# Patient Record
Sex: Male | Born: 1950 | Race: White | Hispanic: No | Marital: Married | Smoking: Never smoker
Health system: Southern US, Community
[De-identification: ages and names within clinical notes are randomized; demographics above are authoritative.]

## PROBLEM LIST (undated history)

## (undated) DIAGNOSIS — I10 Essential (primary) hypertension: Secondary | ICD-10-CM

---

## 2004-02-13 ENCOUNTER — Other Ambulatory Visit: Payer: Self-pay

## 2005-07-28 ENCOUNTER — Ambulatory Visit: Payer: Self-pay | Admitting: Family Medicine

## 2013-05-04 ENCOUNTER — Emergency Department: Payer: Self-pay | Admitting: Emergency Medicine

## 2014-04-21 ENCOUNTER — Ambulatory Visit: Payer: Self-pay | Admitting: Gastroenterology

## 2014-05-19 ENCOUNTER — Emergency Department: Payer: Self-pay | Admitting: Student

## 2014-05-19 LAB — COMPREHENSIVE METABOLIC PANEL
ALBUMIN: 3.8 g/dL (ref 3.4–5.0)
Alkaline Phosphatase: 89 U/L
Anion Gap: 11 (ref 7–16)
BUN: 19 mg/dL — ABNORMAL HIGH (ref 7–18)
Bilirubin,Total: 0.7 mg/dL (ref 0.2–1.0)
CREATININE: 1.78 mg/dL — AB (ref 0.60–1.30)
Calcium, Total: 9 mg/dL (ref 8.5–10.1)
Chloride: 107 mmol/L (ref 98–107)
Co2: 23 mmol/L (ref 21–32)
EGFR (African American): 46 — ABNORMAL LOW
GFR CALC NON AF AMER: 40 — AB
Glucose: 115 mg/dL — ABNORMAL HIGH (ref 65–99)
Osmolality: 284 (ref 275–301)
Potassium: 3.4 mmol/L — ABNORMAL LOW (ref 3.5–5.1)
SGOT(AST): 49 U/L — ABNORMAL HIGH (ref 15–37)
SGPT (ALT): 41 U/L
Sodium: 141 mmol/L (ref 136–145)
Total Protein: 6.8 g/dL (ref 6.4–8.2)

## 2014-05-19 LAB — TROPONIN I: Troponin-I: <0.02 ng/mL

## 2014-05-19 LAB — CBC
HCT: 46.2 % (ref 40.0–52.0)
HGB: 15.6 g/dL (ref 13.0–18.0)
MCH: 30.2 pg (ref 26.0–34.0)
MCHC: 33.7 g/dL (ref 32.0–36.0)
MCV: 90 fL (ref 80–100)
Platelet: 222 10*3/uL (ref 150–440)
RBC: 5.15 10*6/uL (ref 4.40–5.90)
RDW: 14.2 % (ref 11.5–14.5)
WBC: 8.8 10*3/uL (ref 3.8–10.6)

## 2014-05-19 LAB — APTT: ACTIVATED PTT: 24.4 s (ref 23.6–35.9)

## 2014-05-19 LAB — PROTIME-INR
INR: 1.1
PROTHROMBIN TIME: 14 s (ref 11.5–14.7)

## 2015-08-01 IMAGING — CR DG CHEST 1V PORT
1 series · 1 of 1 positions shown · non-contrast
Comparison: None.

CLINICAL DATA: Syncope

EXAM:
PORTABLE CHEST - 1 VIEW

[ap]
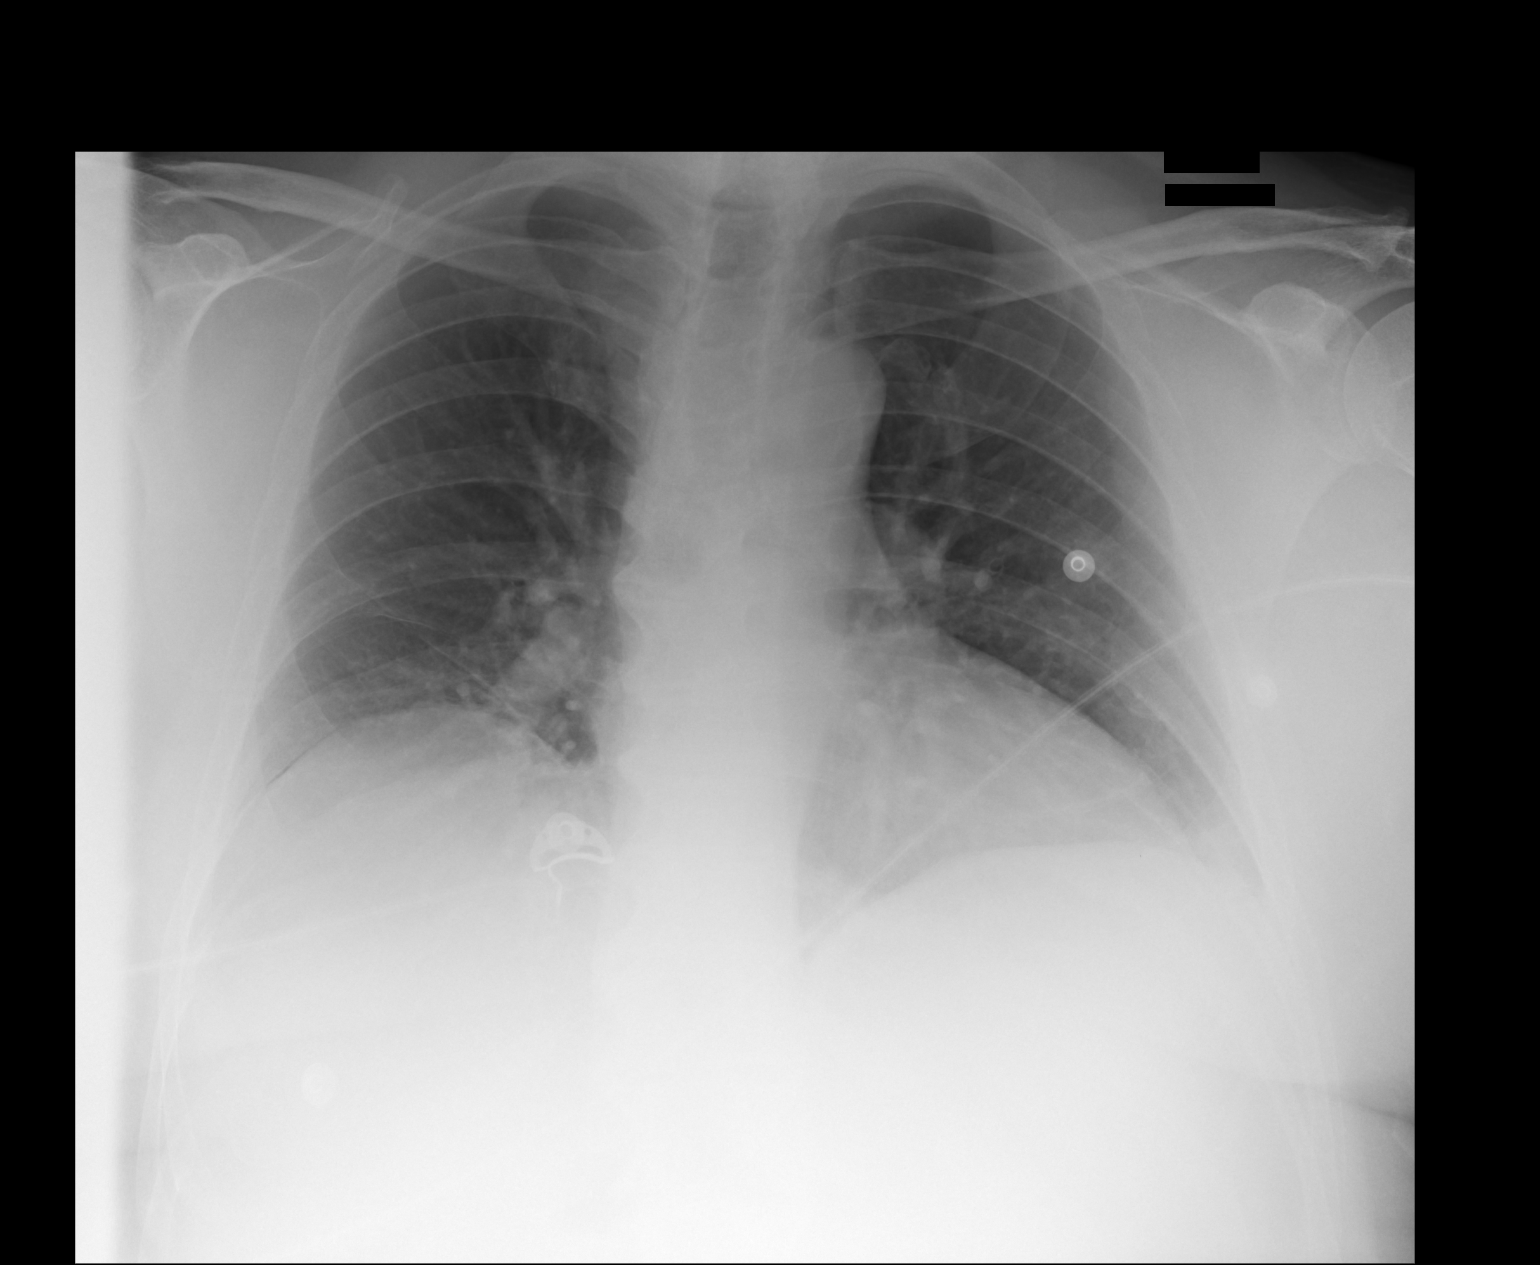

[1 of 1 positions shown; findings below may reference images not displayed]

FINDINGS: Cardiomediastinal silhouette is unremarkable. There is elevation of
the right hemidiaphragm. No acute infiltrate or pleural effusion. No
pulmonary edema. Mild degenerative changes thoracic spine.
IMPRESSION: No active disease.

## 2015-10-24 ENCOUNTER — Other Ambulatory Visit: Payer: Self-pay | Admitting: Family Medicine

## 2015-10-24 DIAGNOSIS — R1011 Right upper quadrant pain: Secondary | ICD-10-CM

## 2015-10-30 ENCOUNTER — Ambulatory Visit
Admission: RE | Admit: 2015-10-30 | Discharge: 2015-10-30 | Disposition: A | Discharge status: Home / Self Care | Payer: BC Managed Care – PPO | Source: Ambulatory Visit | Attending: Family Medicine | Admitting: Family Medicine

## 2015-10-30 DIAGNOSIS — R112 Nausea with vomiting, unspecified: Secondary | ICD-10-CM | POA: Diagnosis not present

## 2015-10-30 DIAGNOSIS — R1011 Right upper quadrant pain: Secondary | ICD-10-CM | POA: Insufficient documentation

## 2015-10-30 DIAGNOSIS — Z9049 Acquired absence of other specified parts of digestive tract: Secondary | ICD-10-CM | POA: Insufficient documentation

## 2018-10-20 ENCOUNTER — Other Ambulatory Visit: Payer: Self-pay | Admitting: Family Medicine

## 2018-10-20 DIAGNOSIS — Z136 Encounter for screening for cardiovascular disorders: Secondary | ICD-10-CM

## 2018-10-20 DIAGNOSIS — R109 Unspecified abdominal pain: Secondary | ICD-10-CM

## 2018-11-04 ENCOUNTER — Ambulatory Visit: Payer: Commercial Managed Care - HMO

## 2018-11-11 ENCOUNTER — Ambulatory Visit
Admission: RE | Admit: 2018-11-11 | Discharge: 2018-11-11 | Disposition: A | Discharge status: Home / Self Care | Payer: Medicare Other | Source: Ambulatory Visit | Attending: Family Medicine | Admitting: Family Medicine

## 2018-11-11 DIAGNOSIS — R109 Unspecified abdominal pain: Secondary | ICD-10-CM | POA: Diagnosis present

## 2018-11-11 DIAGNOSIS — Z136 Encounter for screening for cardiovascular disorders: Secondary | ICD-10-CM | POA: Diagnosis present

## 2019-05-20 ENCOUNTER — Emergency Department
Admission: EM | Admit: 2019-05-20 | Discharge: 2019-05-20 | Disposition: A | Discharge status: Home / Self Care | Payer: Medicare Other | Attending: Emergency Medicine | Admitting: Emergency Medicine

## 2019-05-20 ENCOUNTER — Other Ambulatory Visit: Payer: Self-pay

## 2019-05-20 ENCOUNTER — Emergency Department: Payer: Medicare Other

## 2019-05-20 DIAGNOSIS — R609 Edema, unspecified: Secondary | ICD-10-CM

## 2019-05-20 DIAGNOSIS — R600 Localized edema: Secondary | ICD-10-CM

## 2019-05-20 DIAGNOSIS — R2243 Localized swelling, mass and lump, lower limb, bilateral: Secondary | ICD-10-CM | POA: Diagnosis not present

## 2019-05-20 DIAGNOSIS — F17228 Nicotine dependence, chewing tobacco, with other nicotine-induced disorders: Secondary | ICD-10-CM | POA: Diagnosis not present

## 2019-05-20 DIAGNOSIS — Z79899 Other long term (current) drug therapy: Secondary | ICD-10-CM | POA: Insufficient documentation

## 2019-05-20 LAB — COMPREHENSIVE METABOLIC PANEL
ALT: 41 U/L (ref 0–44)
AST: 32 U/L (ref 15–41)
Albumin: 4.9 g/dL (ref 3.5–5.0)
Alkaline Phosphatase: 102 U/L (ref 38–126)
Anion gap: 13 (ref 5–15)
BUN: 12 mg/dL (ref 8–23)
CO2: 24 mmol/L (ref 22–32)
Calcium: 10.3 mg/dL (ref 8.9–10.3)
Chloride: 102 mmol/L (ref 98–111)
Creatinine, Ser: 1.01 mg/dL (ref 0.61–1.24)
GFR calc Af Amer: >60 mL/min (ref >60)
GFR calc non Af Amer: >60 mL/min (ref >60)
Glucose, Bld: 106 mg/dL — ABNORMAL HIGH (ref 70–99)
Potassium: 3.8 mmol/L (ref 3.5–5.1)
Sodium: 139 mmol/L (ref 135–145)
Total Bilirubin: 1.1 mg/dL (ref 0.3–1.2)
Total Protein: 7.8 g/dL (ref 6.5–8.1)

## 2019-05-20 LAB — CBC WITH DIFFERENTIAL/PLATELET
Abs Immature Granulocytes: 0.04 10*3/uL (ref 0.00–0.07)
Basophils Absolute: 0.1 10*3/uL (ref 0.0–0.1)
Basophils Relative: 1 %
Eosinophils Absolute: 0.2 10*3/uL (ref 0.0–0.5)
Eosinophils Relative: 2 %
HCT: 48.4 % (ref 39.0–52.0)
Hemoglobin: 16.4 g/dL (ref 13.0–17.0)
Immature Granulocytes: 1 %
Lymphocytes Relative: 19 %
Lymphs Abs: 1.5 10*3/uL (ref 0.7–4.0)
MCH: 29.7 pg (ref 26.0–34.0)
MCHC: 33.9 g/dL (ref 30.0–36.0)
MCV: 87.7 fL (ref 80.0–100.0)
Monocytes Absolute: 1.1 10*3/uL — ABNORMAL HIGH (ref 0.1–1.0)
Monocytes Relative: 14 %
Neutro Abs: 5 10*3/uL (ref 1.7–7.7)
Neutrophils Relative %: 63 %
Platelets: 216 10*3/uL (ref 150–400)
RBC: 5.52 MIL/uL (ref 4.22–5.81)
RDW: 14 % (ref 11.5–15.5)
WBC: 7.8 10*3/uL (ref 4.0–10.5)
nRBC: 0 % (ref 0.0–0.2)

## 2019-05-20 LAB — BRAIN NATRIURETIC PEPTIDE: B Natriuretic Peptide: 27 pg/mL (ref 0.0–100.0)

## 2019-05-20 MED ORDER — AMLODIPINE BESYLATE 5 MG PO TABS: 5.0000 mg | ORAL_TABLET | Freq: Every day | ORAL | 2 refills | Status: DC

## 2019-05-20 NOTE — Discharge Instructions (Signed)
Wear compression stockings at home. Take 5 mg of amlodipine daily instead of the 10 mg tablets you have been taking.

## 2019-05-20 NOTE — ED Triage Notes (Signed)
Pt c/o hand and feet swelling last 2 days. Reports hand swelling has decreased but feet still swollen. Pt started he started taking new medication prior to swelling. Pt states he had stopped taking medications due to cost and recently was able to get them filled. Pt has list of medication he just restarted.  Started taking  Pt  NAD noted at this time.

## 2019-05-20 NOTE — ED Provider Notes (Signed)
[facility] Emergency Department Provider Note  ____________________________________________  Time seen: Approximately 7:06 PM  I have reviewed the triage vital signs and the nursing notes.   HISTORY  Chief Complaint Leg Swelling    HPI Larry KatosRobert B Wolfgang III is a 68 y.o. male presents to the emergency department with bilateral lower leg swelling over the past 2 days.  Patient recently started taking amlodipine as well as other blood pressure medications.  Patient denies any history of CHF.  No shortness of breath, cough or increased pillow usage at home.  Patient denies any history of hepatic issues in the past.  Patient denies prolonged immobility, recent surgery or daily smoking.  No prior history of DVT or PE.  He denies chest pain, chest tightness or abdominal pain.  No other alleviating measures have been attempted.        History reviewed. No pertinent past medical history.  There are no active problems to display for this patient.   History reviewed. No pertinent surgical history.  Prior to Admission medications   Medication Sig Start Date End Date Taking? Authorizing Provider  albuterol (VENTOLIN HFA) 108 (90 Base) MCG/ACT inhaler Inhale 2 puffs into the lungs every 4 (four) hours as needed for wheezing or shortness of breath.   Yes [provider]  allopurinol (ZYLOPRIM) 100 MG tablet Take 200 mg by mouth daily.   Yes [provider]  famotidine (PEPCID) 20 MG tablet Take 20 mg by mouth 2 (two) times daily.   Yes [provider]  gabapentin (NEURONTIN) 300 MG capsule Take 300 mg by mouth 3 (three) times daily.   Yes [provider]  indomethacin (INDOCIN) 25 MG capsule Take 25 mg by mouth 2 (two) times daily with a meal.   Yes [provider]  lisinopril-hydrochlorothiazide (ZESTORETIC) 20-25 MG tablet Take 1 tablet by mouth daily.   Yes [provider]  lovastatin (MEVACOR) 40 MG tablet Take 40 mg  by mouth at bedtime.   Yes [provider]  metoprolol tartrate (LOPRESSOR) 100 MG tablet Take 100 mg by mouth 2 (two) times daily.   Yes [provider]  potassium chloride (K-DUR) 10 MEQ tablet Take 10 mEq by mouth daily.   Yes [provider]  primidone (MYSOLINE) 50 MG tablet Take by mouth 4 (four) times daily.   Yes [provider]  amLODipine (NORVASC) 5 MG tablet Take 1 tablet (5 mg total) by mouth daily. 05/20/19 06/19/19  Orvil FeilWoods, Arkie Tagliaferro M, PA-C    Allergies Patient has no allergy information on record.  History reviewed. No pertinent family history.  Social History Social History   Tobacco Use  . Smoking status: Never Smoker  . Smokeless tobacco: Current User  Substance Use Topics  . Alcohol use: Yes  . Drug use: Never     Review of Systems  Constitutional: No fever/chills Eyes: No visual changes. No discharge ENT: No upper respiratory complaints. Cardiovascular: no chest pain. Respiratory: no cough. No SOB. Gastrointestinal: No abdominal pain.  No nausea, no vomiting.  No diarrhea.  No constipation. Genitourinary: Negative for dysuria. No hematuria Musculoskeletal: Negative for musculoskeletal pain. Skin: Patient has bilateral pitting edema of the ankles. Neurological: Negative for headaches, focal weakness or numbness.  ____________________________________________   PHYSICAL EXAM:  VITAL SIGNS: ED Triage Vitals  Enc Vitals Group     BP 05/20/19 1813 (!) 162/89     Pulse Rate 05/20/19 1813 60     Resp 05/20/19 1813 18  Temp 05/20/19 1813 98 F (36.7 C)     Temp src --      SpO2 05/20/19 1813 95 %     Weight 05/20/19 1826 176 lb (79.8 kg)     Height 05/20/19 1826 5' 1.5" (1.562 m)     Head Circumference --      Peak Flow --      Pain Score 05/20/19 1825 0     Pain Loc --      Pain Edu? --      Excl. in [place]? --      Constitutional: Alert and oriented. Well appearing and in no acute distress. Eyes: Conjunctivae  are normal. PERRL. EOMI. Head: Atraumatic. Cardiovascular: Normal rate, regular rhythm. Normal S1 and S2.  Good peripheral circulation. Respiratory: Normal respiratory effort without tachypnea or retractions. Lungs CTAB. Good air entry to the bases with no decreased or absent breath sounds. Gastrointestinal: Bowel sounds 4 quadrants. Soft and nontender to palpation. No guarding or rigidity. No palpable masses. No distention. No CVA tenderness. Musculoskeletal: Full range of motion to all extremities. No gross deformities appreciated. Neurologic:  Normal speech and language. No gross focal neurologic deficits are appreciated.  Skin: Patient has 2+ pitting edema of the bilateral ankles.  No erythema overlying the lower extremities. Psychiatric: Mood and affect are normal. Speech and behavior are normal. Patient exhibits appropriate insight and judgement.   ____________________________________________   LABS (all labs ordered are listed, but only abnormal results are displayed)  Labs Reviewed  CBC WITH DIFFERENTIAL/PLATELET - Abnormal; Notable for the following components:      Result Value   Monocytes Absolute 1.1 (*)    All other components within normal limits  COMPREHENSIVE METABOLIC PANEL - Abnormal; Notable for the following components:   Glucose, Bld 106 (*)    All other components within normal limits  BRAIN NATRIURETIC PEPTIDE   ____________________________________________  EKG   ____________________________________________  RADIOLOGY I personally viewed and evaluated these images as part of my medical decision making, as well as reviewing the written report by the radiologist.    [place] Venous Img Lower Bilateral  Result Date: 05/20/2019 CLINICAL DATA:  Rule out DVT EXAM: BILATERAL LOWER EXTREMITY VENOUS DOPPLER ULTRASOUND TECHNIQUE: Gray-scale sonography with graded compression, as well as color Doppler and duplex ultrasound were performed to evaluate the lower extremity  deep venous systems from the level of the common femoral vein and including the common femoral, femoral, profunda femoral, popliteal and calf veins including the posterior tibial, peroneal and gastrocnemius veins when visible. The superficial great saphenous vein was also interrogated. Spectral Doppler was utilized to evaluate flow at rest and with distal augmentation maneuvers in the common femoral, femoral and popliteal veins. COMPARISON:  None. FINDINGS: RIGHT LOWER EXTREMITY Common Femoral Vein: No evidence of thrombus. Normal compressibility, respiratory phasicity and response to augmentation. Saphenofemoral Junction: No evidence of thrombus. Normal compressibility and flow on color Doppler imaging. Profunda Femoral Vein: No evidence of thrombus. Normal compressibility and flow on color Doppler imaging. Femoral Vein: No evidence of thrombus. Normal compressibility, respiratory phasicity and response to augmentation. Popliteal Vein: No evidence of thrombus. Normal compressibility, respiratory phasicity and response to augmentation. Calf Veins: No evidence of thrombus. Normal compressibility and flow on color Doppler imaging. Superficial Great Saphenous Vein: No evidence of thrombus. Normal compressibility. Venous Reflux:  None. Other Findings:  None. LEFT LOWER EXTREMITY Common Femoral Vein: No evidence of thrombus. Normal compressibility, respiratory phasicity and response to augmentation. Saphenofemoral Junction: No evidence of thrombus.  Normal compressibility and flow on color Doppler imaging. Profunda Femoral Vein: No evidence of thrombus. Normal compressibility and flow on color Doppler imaging. Femoral Vein: No evidence of thrombus. Normal compressibility, respiratory phasicity and response to augmentation. Popliteal Vein: No evidence of thrombus. Normal compressibility, respiratory phasicity and response to augmentation. Calf Veins: No evidence of thrombus. Normal compressibility and flow on color Doppler  imaging. Superficial Great Saphenous Vein: No evidence of thrombus. Normal compressibility. Venous Reflux:  None. Other Findings:  None. IMPRESSION: No evidence of deep venous thrombosis in either lower extremity. Electronically Signed   By: Eddie Candle M.D.   On: 05/20/2019 20:32    ____________________________________________    PROCEDURES  Procedure(s) performed:    Procedures    Medications - No data to display   ____________________________________________   INITIAL IMPRESSION / ASSESSMENT AND PLAN / ED COURSE  Pertinent labs & imaging results that were available during my care of the patient were reviewed by me and considered in my medical decision making (see chart for details).  Review of the [place] CSRS was performed in accordance of the [place] prior to dispensing any controlled drugs.         Assessment and Plan: Peripheral edema 68 year old male presents to the emergency department with bilateral lower extremity swelling since starting new hypertension medications.  Patient was hypertensive in triage but vital signs were otherwise reassuring.  On physical exam, patient had 2+ pitting edema of the bilateral ankles.  No overlying erythema to suggest DVT.   Differential diagnosis includes DVT, renal insufficiency, venous insufficiency, medication side effect...  CBC and CMP were obtained.  No signs of renal insufficiency on CMP.  Venous ultrasound of the bilateral lower extremities revealed no evidence of DVT.  Peripheral edema from hypertension medications is likely at this time.  Patient's amlodipine dosing was cut in half from 10 mg to 5 mg.  He was advised to follow-up with primary care in 2 wear compression stockings daily.  All patient questions were answered.   ____________________________________________  FINAL CLINICAL IMPRESSION(S) / ED DIAGNOSES  Final diagnoses:  Peripheral edema      NEW MEDICATIONS STARTED DURING THIS VISIT:  ED Discharge  Orders         Ordered    amLODipine (NORVASC) 5 MG tablet  Daily     05/20/19 2049              This chart was dictated using voice recognition software/Dragon. Despite best efforts to proofread, errors can occur which can change the meaning. Any change was purely unintentional.    Lannie Fields, PA-C 05/20/19 2058    Nance Pear, MD 05/20/19 2124

## 2019-06-09 ENCOUNTER — Other Ambulatory Visit: Payer: Self-pay

## 2019-06-09 DIAGNOSIS — Z20822 Contact with and (suspected) exposure to covid-19: Secondary | ICD-10-CM

## 2019-06-10 LAB — NOVEL CORONAVIRUS, NAA: SARS-CoV-2, NAA: NOT DETECTED

## 2019-09-26 ENCOUNTER — Emergency Department
Admission: EM | Admit: 2019-09-26 | Discharge: 2019-09-26 | Disposition: A | Discharge status: Home / Self Care | Payer: Medicare Other | Attending: Emergency Medicine | Admitting: Emergency Medicine

## 2019-09-26 ENCOUNTER — Emergency Department: Payer: Medicare Other

## 2019-09-26 ENCOUNTER — Other Ambulatory Visit: Payer: Self-pay

## 2019-09-26 ENCOUNTER — Encounter: Payer: Self-pay | Admitting: Emergency Medicine

## 2019-09-26 DIAGNOSIS — M79604 Pain in right leg: Secondary | ICD-10-CM | POA: Diagnosis not present

## 2019-09-26 DIAGNOSIS — M25551 Pain in right hip: Secondary | ICD-10-CM | POA: Diagnosis present

## 2019-09-26 DIAGNOSIS — Z79899 Other long term (current) drug therapy: Secondary | ICD-10-CM | POA: Insufficient documentation

## 2019-09-26 DIAGNOSIS — I10 Essential (primary) hypertension: Secondary | ICD-10-CM | POA: Insufficient documentation

## 2019-09-26 HISTORY — DX: Essential (primary) hypertension: I10

## 2019-09-26 MED ORDER — NAPROXEN 500 MG PO TABS: 500.0000 mg | ORAL_TABLET | Freq: Two times a day (BID) | ORAL | 0 refills | Status: DC

## 2019-09-26 MED ORDER — HYDROCODONE-ACETAMINOPHEN 5-325 MG PO TABS: 1.0000 | ORAL_TABLET | Freq: Four times a day (QID) | ORAL | 0 refills | Status: AC | PRN

## 2019-09-26 NOTE — ED Notes (Signed)
D/c instructions reviewed with pt by Vladimir Crofts, NP. Pt verbalized understanding. No esign available at this time.

## 2019-09-26 NOTE — Discharge Instructions (Signed)
Please follow up with your primary care provider for symptoms that are not improving over the next few days.  Return to the ER for symptoms that change or worsen if unable to schedule an appointment. 

## 2019-09-26 NOTE — ED Triage Notes (Signed)
Pt reports was walking backwards a week ago and tripped while leaf blowing. Pt states continues to have pain in his right hip and knee.

## 2019-09-26 NOTE — ED Provider Notes (Signed)
[facility] Emergency Department Provider Note ____________________________________________  Time seen: Approximately 3:55 PM  I have reviewed the triage vital signs and the nursing notes.   HISTORY  Chief Complaint Knee Pain and Hip Pain    HPI Larry [name] is a 68 y.o. male who presents to the emergency department for evaluation and treatment of right hip and knee pain after mechanical, non-syncopal fall about a week ago while blowing leaves. No relief with tylenol or ibuprofen. Pain gets worse after walking a few steps. Leg gets a "cramp" in it.   Past Medical History:  Diagnosis Date  . Hypertension     There are no problems to display for this patient.   History reviewed. No pertinent surgical history.  Prior to Admission medications   Medication Sig Start Date End Date Taking? Authorizing Provider  albuterol (VENTOLIN HFA) 108 (90 Base) MCG/ACT inhaler Inhale 2 puffs into the lungs every 4 (four) hours as needed for wheezing or shortness of breath.    [provider]  allopurinol (ZYLOPRIM) 100 MG tablet Take 200 mg by mouth daily.    [provider]  amLODipine (NORVASC) 5 MG tablet Take 1 tablet (5 mg total) by mouth daily. 05/20/19 06/19/19  Orvil Feil, PA-C  famotidine (PEPCID) 20 MG tablet Take 20 mg by mouth 2 (two) times daily.    [provider]  gabapentin (NEURONTIN) 300 MG capsule Take 300 mg by mouth 3 (three) times daily.    [provider]  HYDROcodone-acetaminophen (NORCO/VICODIN) 5-325 MG tablet Take 1 tablet by mouth every 6 (six) hours as needed for up to 3 days for severe pain. 09/26/19 09/29/19  Maisa Bedingfield, Rulon Eisenmenger B, FNP  indomethacin (INDOCIN) 25 MG capsule Take 25 mg by mouth 2 (two) times daily with a meal.    [provider]  lisinopril-hydrochlorothiazide (ZESTORETIC) 20-25 MG tablet Take 1 tablet by mouth daily.    [provider]  lovastatin (MEVACOR) 40 MG tablet  Take 40 mg by mouth at bedtime.    [provider]  metoprolol tartrate (LOPRESSOR) 100 MG tablet Take 100 mg by mouth 2 (two) times daily.    [provider]  naproxen (NAPROSYN) 500 MG tablet Take 1 tablet (500 mg total) by mouth 2 (two) times daily with a meal. 09/26/19   Celester Lech B, FNP  potassium chloride (K-DUR) 10 MEQ tablet Take 10 mEq by mouth daily.    [provider]  primidone (MYSOLINE) 50 MG tablet Take by mouth 4 (four) times daily.    [provider]    Allergies Patient has no allergy information on record.  No family history on file.  Social History Social History   Tobacco Use  . Smoking status: Never Smoker  . Smokeless tobacco: Current User  Substance Use Topics  . Alcohol use: Yes  . Drug use: Never    Review of Systems Constitutional: Negative for fever. Cardiovascular: Negative for chest pain. Respiratory: Negative for shortness of breath. Musculoskeletal: Positive for right hip and upper leg pain. Skin: Denies contusion or abrasion.  Neurological: Negative for decrease in sensation  ____________________________________________   PHYSICAL EXAM:  VITAL SIGNS: ED Triage Vitals  Enc Vitals Group     BP 09/26/19 1417 (!) 171/100     Pulse Rate 09/26/19 1417 63     Resp 09/26/19 1417 20     Temp 09/26/19 1417 98.5 F (36.9 C)     Temp Source 09/26/19 1417 Oral  SpO2 09/26/19 1417 100 %     Weight 09/26/19 1405 167 lb (75.8 kg)     Height 09/26/19 1405 5\' 1"  (1.549 m)     Head Circumference --      Peak Flow --      Pain Score 09/26/19 1404 9     Pain Loc --      Pain Edu? --      Excl. in [place]? --     Constitutional: Alert and oriented. Well appearing and in no acute distress. Eyes: Conjunctivae are clear without discharge or drainage Head: Atraumatic Neck: Supple. No midline tenderness. Respiratory: No cough. Respirations are even and unlabored. Musculoskeletal: Antalgic but steady gait  observed. Pain to palpation over posterior aspect of proximal femur. Diffuse tenderness over anterior thigh. No increase in pain with internal or external rotation of the right hip. Neurologic: No numbness or tingling of the right lower extremtiy.   Psychiatric: Affect and behavior are appropriate.  ____________________________________________   LABS (all labs ordered are listed, but only abnormal results are displayed)  Labs Reviewed - No data to display ____________________________________________  RADIOLOGY Ritht hip and knee both negative for acute findings per radiology.  ____________________________________________   PROCEDURES  Procedures  ____________________________________________   INITIAL IMPRESSION / ASSESSMENT AND PLAN / ED COURSE  Larry [name] is a 68 y.o. who presents to the emergency department for treatment and evaluation after mechanical, non-syncopal fall a week ago. Exam and images are reassuring. He is requesting medications other than tylenol or ibuprofen as he says he has tried them and they don't help at all.   He will be prescribed Naprosyn and Norco. He was advised that the [place] may cause dizziness/drowsiness and was advised to change positions slowly.   Patient instructed to follow-up with orthopedics if not improving over the next few days.  He was also instructed to return to the emergency department for symptoms that change or worsen if unable schedule an appointment with orthopedics or primary care. Ambulated out of the department.  Medications - No data to display  Pertinent labs & imaging results that were available during my care of the patient were reviewed by me and considered in my medical decision making (see chart for details).  _________________________________________   FINAL CLINICAL IMPRESSION(S) / ED DIAGNOSES  Final diagnoses:  Acute hip pain, right  Acute leg pain, right    ED Discharge Orders         Ordered     HYDROcodone-acetaminophen (NORCO/VICODIN) 5-325 MG tablet  Every 6 hours PRN     09/26/19 1634    naproxen (NAPROSYN) 500 MG tablet  2 times daily with meals     09/26/19 1634           If controlled substance prescribed during this visit, 12 month history viewed on the [place] prior to issuing an initial prescription for Schedule II or [name] opiod.   Victorino Dike, FNP 09/26/19 1657    Duffy Bruce, MD 09/27/19 1038

## 2020-12-08 IMAGING — CR DG KNEE COMPLETE 4+V*R*
4 series · 4 of 4 positions shown · non-contrast
Comparison: None.

CLINICAL DATA: Right knee pain after fall last week.

EXAM:
RIGHT KNEE - COMPLETE 4+ VIEW

[knee obl (1 of 2)]
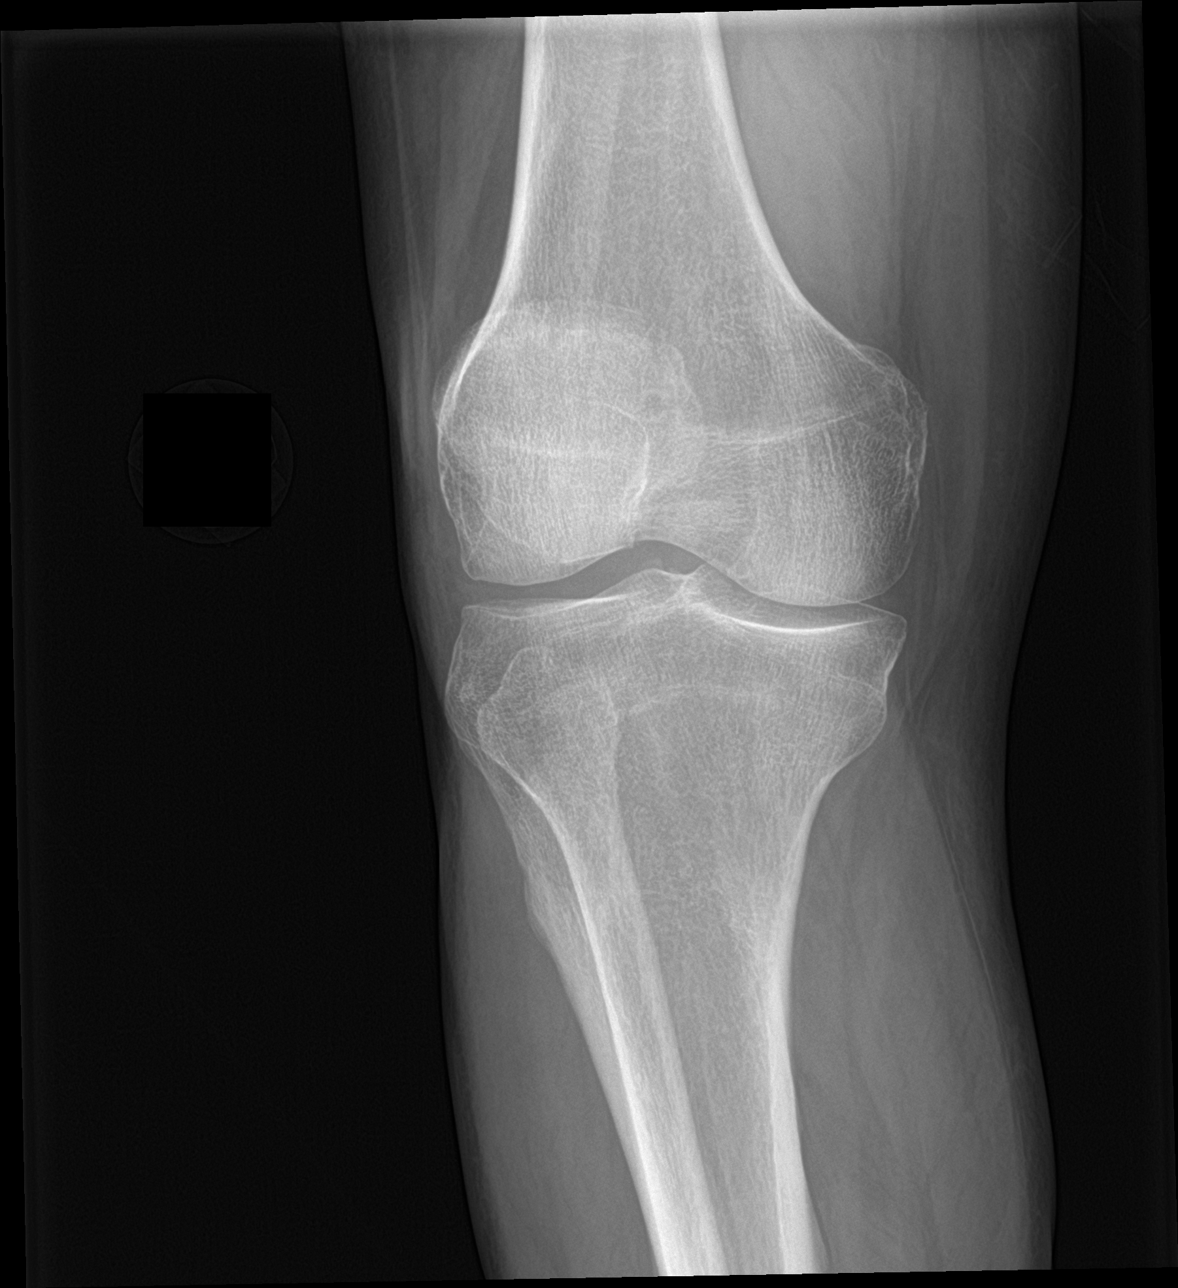

[knee ap]
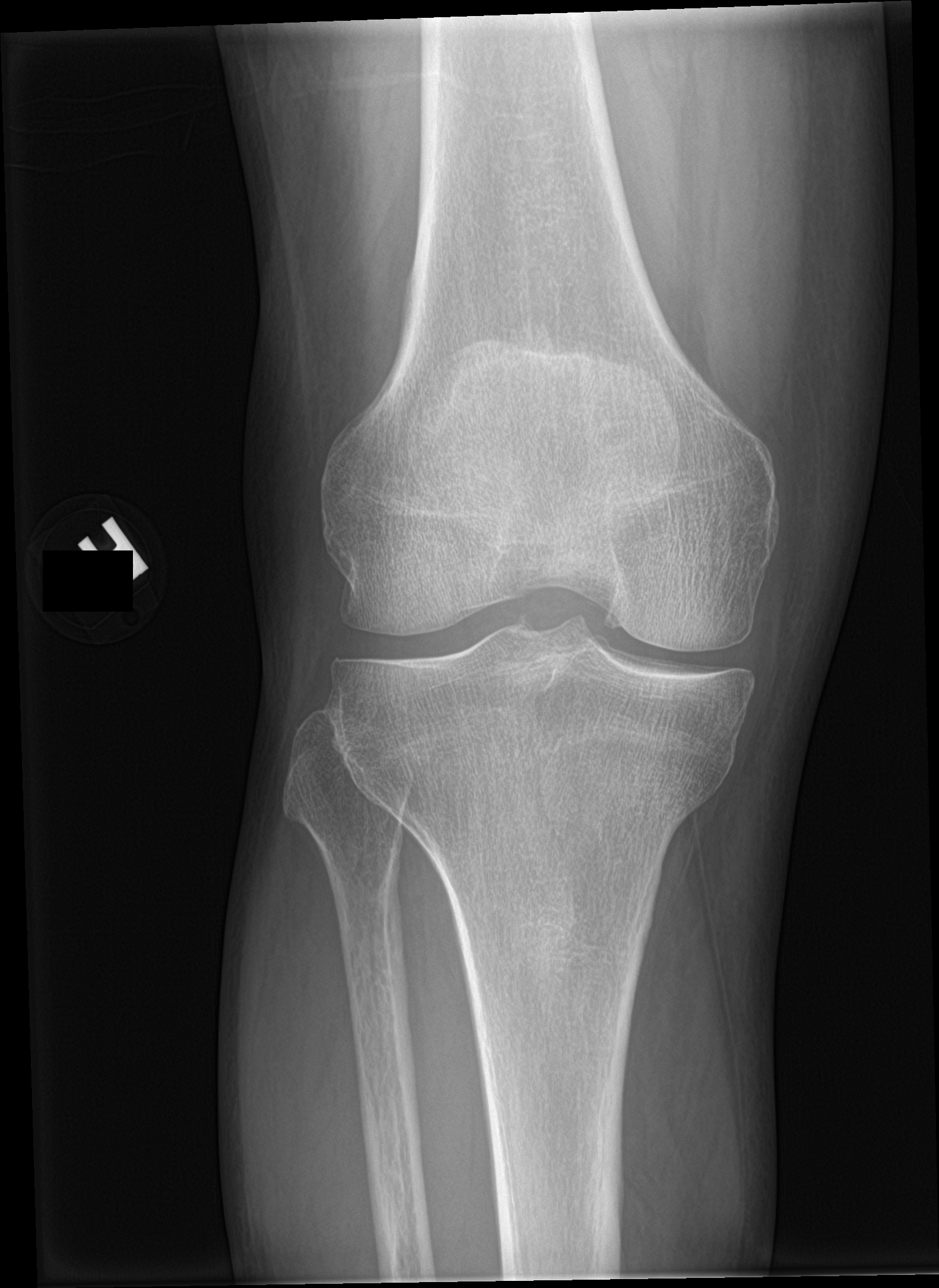

[knee obl (2 of 2)]
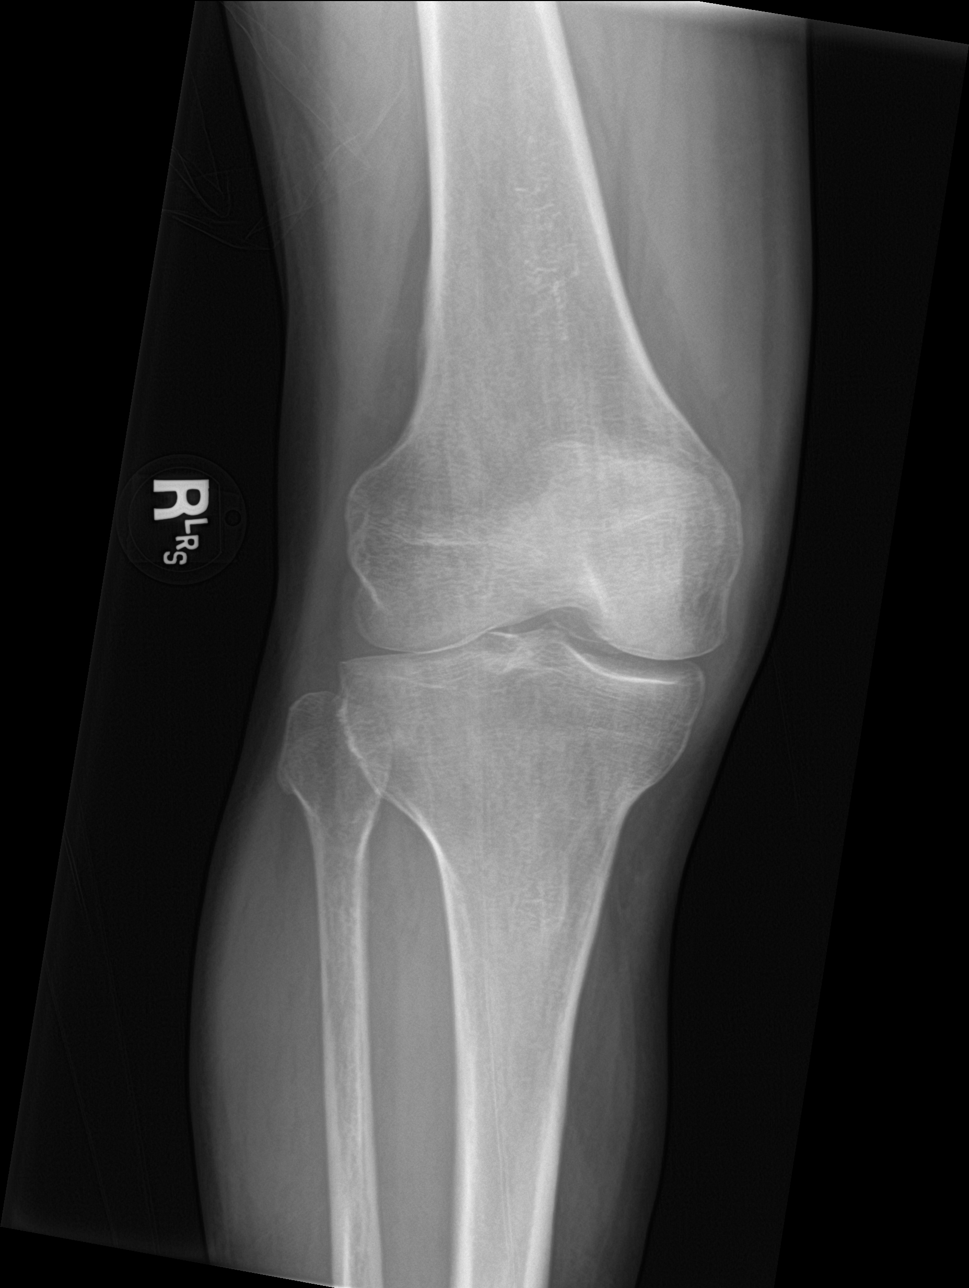

[knee lat]
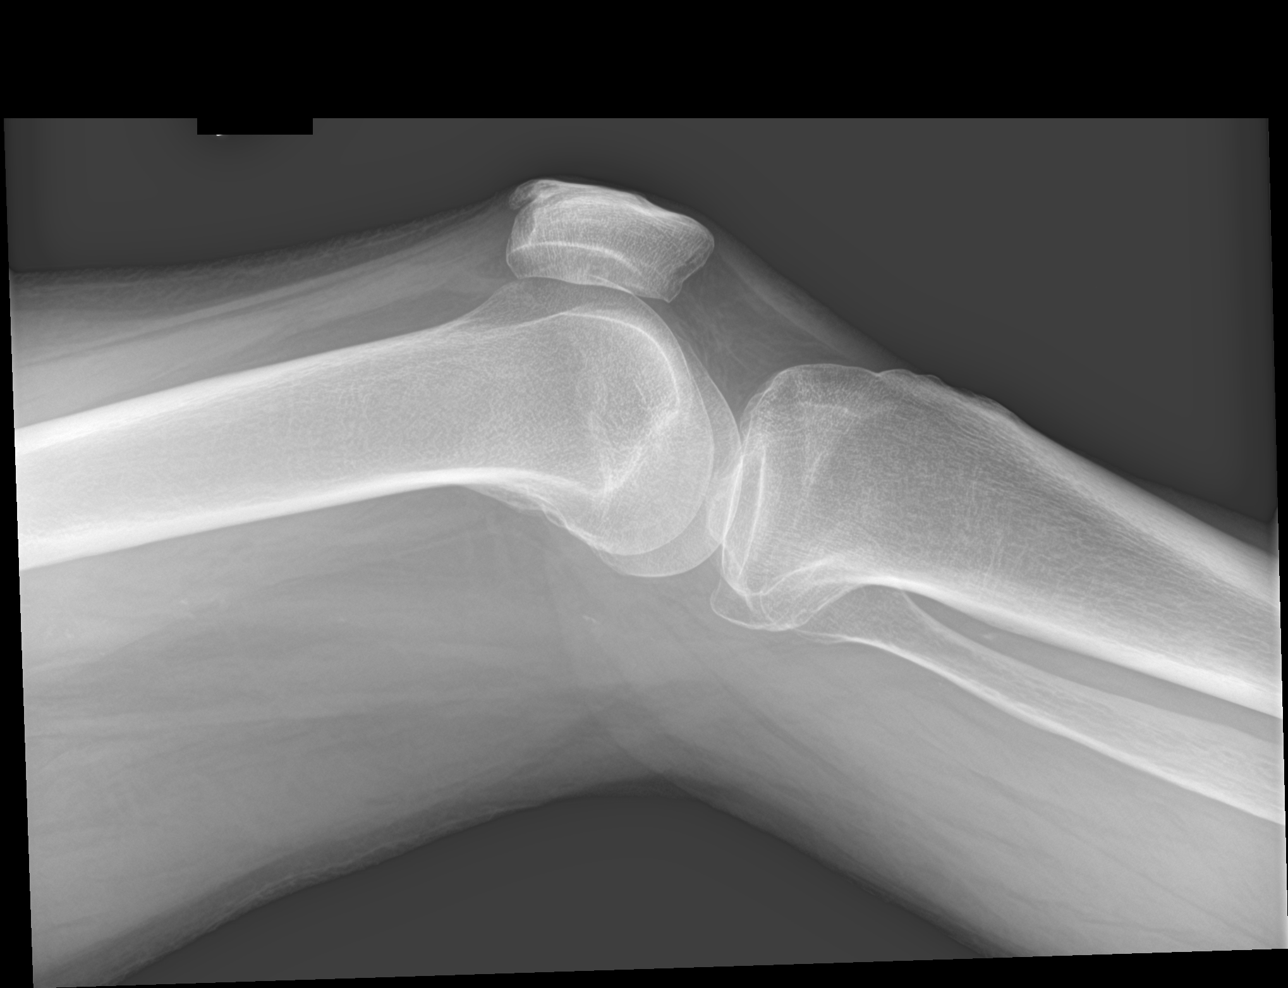

[4 of 4 positions shown; findings below may reference images not displayed]

FINDINGS: No evidence of fracture, dislocation, or joint effusion. No evidence
of arthropathy or other focal bone abnormality. Soft tissues are
unremarkable.
IMPRESSION: Negative.

## 2021-06-28 ENCOUNTER — Other Ambulatory Visit: Payer: Self-pay | Admitting: Family Medicine

## 2021-06-28 ENCOUNTER — Other Ambulatory Visit (HOSPITAL_COMMUNITY): Payer: Self-pay | Admitting: Family Medicine

## 2021-06-28 DIAGNOSIS — R251 Tremor, unspecified: Secondary | ICD-10-CM

## 2022-11-03 ENCOUNTER — Other Ambulatory Visit: Payer: Self-pay | Admitting: Internal Medicine

## 2022-11-03 DIAGNOSIS — R931 Abnormal findings on diagnostic imaging of heart and coronary circulation: Secondary | ICD-10-CM

## 2022-11-19 ENCOUNTER — Telehealth (HOSPITAL_COMMUNITY): Payer: Self-pay | Admitting: Emergency Medicine

## 2022-11-19 NOTE — Telephone Encounter (Signed)
Vm box not set up [facility] [phone] Office  [phone] Cell

## 2022-11-20 ENCOUNTER — Ambulatory Visit: Admission: RE | Admit: 2022-11-20 | Payer: BC Managed Care – PPO | Source: Ambulatory Visit

## 2022-11-24 ENCOUNTER — Ambulatory Visit
Admission: RE | Admit: 2022-11-24 | Discharge: 2022-11-24 | Disposition: A | Discharge status: Home / Self Care | Payer: Medicare PPO | Source: Ambulatory Visit | Attending: Internal Medicine | Admitting: Internal Medicine

## 2022-11-24 DIAGNOSIS — K5730 Diverticulosis of large intestine without perforation or abscess without bleeding: Secondary | ICD-10-CM | POA: Insufficient documentation

## 2022-11-24 DIAGNOSIS — I2510 Atherosclerotic heart disease of native coronary artery without angina pectoris: Secondary | ICD-10-CM | POA: Insufficient documentation

## 2022-11-24 DIAGNOSIS — I700 Atherosclerosis of aorta: Secondary | ICD-10-CM | POA: Diagnosis not present

## 2022-11-24 DIAGNOSIS — K8020 Calculus of gallbladder without cholecystitis without obstruction: Secondary | ICD-10-CM | POA: Diagnosis not present

## 2022-11-24 DIAGNOSIS — K314 Gastric diverticulum: Secondary | ICD-10-CM | POA: Insufficient documentation

## 2022-11-24 DIAGNOSIS — R931 Abnormal findings on diagnostic imaging of heart and coronary circulation: Secondary | ICD-10-CM | POA: Insufficient documentation

## 2022-11-24 MED ORDER — NITROGLYCERIN 0.4 MG SL SUBL
0.8000 mg | SUBLINGUAL_TABLET | Freq: Once | SUBLINGUAL | Status: AC
  Administered 2022-11-24: 0.8 mg via SUBLINGUAL

## 2022-11-24 MED ORDER — IOHEXOL 350 MG/ML SOLN
100.0000 mL | Freq: Once | INTRAVENOUS | Status: AC | PRN
  Administered 2022-11-24: 100 mL via INTRAVENOUS

## 2022-11-24 NOTE — Progress Notes (Signed)
Pt tolerated procedure well and no signs of distress. ABC intact, pt encouraged to drink extra fluids throughout the day. No complaint of dizziness, water provided prior to discharge. Pt will follow up with ordering provider.

## 2022-12-18 ENCOUNTER — Ambulatory Visit: Payer: BC Managed Care – PPO

## 2023-07-29 ENCOUNTER — Other Ambulatory Visit: Payer: Self-pay

## 2023-07-29 ENCOUNTER — Inpatient Hospital Stay
Admission: EM | Admit: 2023-07-29 | Discharge: 2023-08-06 | DRG: 280 | Disposition: A | Discharge status: Home / Self Care | Payer: Medicare HMO | Attending: Internal Medicine | Admitting: Internal Medicine

## 2023-07-29 DIAGNOSIS — R0603 Acute respiratory distress: Secondary | ICD-10-CM | POA: Diagnosis not present

## 2023-07-29 DIAGNOSIS — I5021 Acute systolic (congestive) heart failure: Secondary | ICD-10-CM | POA: Diagnosis present

## 2023-07-29 DIAGNOSIS — E669 Obesity, unspecified: Secondary | ICD-10-CM | POA: Diagnosis present

## 2023-07-29 DIAGNOSIS — E1122 Type 2 diabetes mellitus with diabetic chronic kidney disease: Secondary | ICD-10-CM | POA: Diagnosis present

## 2023-07-29 DIAGNOSIS — E876 Hypokalemia: Secondary | ICD-10-CM | POA: Diagnosis present

## 2023-07-29 DIAGNOSIS — I130 Hypertensive heart and chronic kidney disease with heart failure and stage 1 through stage 4 chronic kidney disease, or unspecified chronic kidney disease: Secondary | ICD-10-CM | POA: Diagnosis not present

## 2023-07-29 DIAGNOSIS — Z8673 Personal history of transient ischemic attack (TIA), and cerebral infarction without residual deficits: Secondary | ICD-10-CM

## 2023-07-29 DIAGNOSIS — I509 Heart failure, unspecified: Secondary | ICD-10-CM

## 2023-07-29 DIAGNOSIS — J441 Chronic obstructive pulmonary disease with (acute) exacerbation: Principal | ICD-10-CM | POA: Diagnosis present

## 2023-07-29 DIAGNOSIS — G2581 Restless legs syndrome: Secondary | ICD-10-CM | POA: Diagnosis present

## 2023-07-29 DIAGNOSIS — Z91128 Patient's intentional underdosing of medication regimen for other reason: Secondary | ICD-10-CM

## 2023-07-29 DIAGNOSIS — Z1152 Encounter for screening for COVID-19: Secondary | ICD-10-CM

## 2023-07-29 DIAGNOSIS — I513 Intracardiac thrombosis, not elsewhere classified: Secondary | ICD-10-CM | POA: Diagnosis present

## 2023-07-29 DIAGNOSIS — Z7902 Long term (current) use of antithrombotics/antiplatelets: Secondary | ICD-10-CM

## 2023-07-29 DIAGNOSIS — Z79899 Other long term (current) drug therapy: Secondary | ICD-10-CM

## 2023-07-29 DIAGNOSIS — I214 Non-ST elevation (NSTEMI) myocardial infarction: Secondary | ICD-10-CM | POA: Diagnosis present

## 2023-07-29 DIAGNOSIS — I255 Ischemic cardiomyopathy: Secondary | ICD-10-CM | POA: Diagnosis present

## 2023-07-29 DIAGNOSIS — R0902 Hypoxemia: Secondary | ICD-10-CM

## 2023-07-29 DIAGNOSIS — R7989 Other specified abnormal findings of blood chemistry: Secondary | ICD-10-CM

## 2023-07-29 DIAGNOSIS — J9601 Acute respiratory failure with hypoxia: Secondary | ICD-10-CM | POA: Diagnosis present

## 2023-07-29 DIAGNOSIS — E1140 Type 2 diabetes mellitus with diabetic neuropathy, unspecified: Secondary | ICD-10-CM | POA: Diagnosis present

## 2023-07-29 DIAGNOSIS — Z6835 Body mass index (BMI) 35.0-35.9, adult: Secondary | ICD-10-CM

## 2023-07-29 DIAGNOSIS — K219 Gastro-esophageal reflux disease without esophagitis: Secondary | ICD-10-CM | POA: Diagnosis present

## 2023-07-29 DIAGNOSIS — I2510 Atherosclerotic heart disease of native coronary artery without angina pectoris: Secondary | ICD-10-CM | POA: Diagnosis present

## 2023-07-29 DIAGNOSIS — Z7901 Long term (current) use of anticoagulants: Secondary | ICD-10-CM

## 2023-07-29 DIAGNOSIS — N1831 Chronic kidney disease, stage 3a: Secondary | ICD-10-CM

## 2023-07-29 DIAGNOSIS — E785 Hyperlipidemia, unspecified: Secondary | ICD-10-CM | POA: Diagnosis present

## 2023-07-29 DIAGNOSIS — I21A1 Myocardial infarction type 2: Principal | ICD-10-CM | POA: Diagnosis present

## 2023-07-29 DIAGNOSIS — G629 Polyneuropathy, unspecified: Secondary | ICD-10-CM

## 2023-07-29 DIAGNOSIS — Z7982 Long term (current) use of aspirin: Secondary | ICD-10-CM

## 2023-07-29 DIAGNOSIS — Z5986 Financial insecurity: Secondary | ICD-10-CM

## 2023-07-29 NOTE — ED Triage Notes (Addendum)
Pt reports shortness of breath and cough x1week, pt denies fevers denies chest pain light headed or dizzy. Pt room air sats 84%, pt has hx copd. Pt placed on 5L

## 2023-07-30 ENCOUNTER — Emergency Department: Payer: Medicare HMO

## 2023-07-30 ENCOUNTER — Inpatient Hospital Stay (HOSPITAL_COMMUNITY)
Admit: 2023-07-30 | Discharge: 2023-07-30 | Disposition: A | Discharge status: Home / Self Care | Payer: Medicare HMO | Attending: Family Medicine | Admitting: Family Medicine

## 2023-07-30 DIAGNOSIS — Z7982 Long term (current) use of aspirin: Secondary | ICD-10-CM | POA: Diagnosis not present

## 2023-07-30 DIAGNOSIS — N1831 Chronic kidney disease, stage 3a: Secondary | ICD-10-CM

## 2023-07-30 DIAGNOSIS — E1122 Type 2 diabetes mellitus with diabetic chronic kidney disease: Secondary | ICD-10-CM | POA: Diagnosis present

## 2023-07-30 DIAGNOSIS — Z5986 Financial insecurity: Secondary | ICD-10-CM | POA: Diagnosis not present

## 2023-07-30 DIAGNOSIS — Z7902 Long term (current) use of antithrombotics/antiplatelets: Secondary | ICD-10-CM | POA: Diagnosis not present

## 2023-07-30 DIAGNOSIS — Z8673 Personal history of transient ischemic attack (TIA), and cerebral infarction without residual deficits: Secondary | ICD-10-CM | POA: Diagnosis not present

## 2023-07-30 DIAGNOSIS — E876 Hypokalemia: Secondary | ICD-10-CM | POA: Diagnosis present

## 2023-07-30 DIAGNOSIS — E785 Hyperlipidemia, unspecified: Secondary | ICD-10-CM | POA: Diagnosis present

## 2023-07-30 DIAGNOSIS — Z6835 Body mass index (BMI) 35.0-35.9, adult: Secondary | ICD-10-CM | POA: Diagnosis not present

## 2023-07-30 DIAGNOSIS — I2510 Atherosclerotic heart disease of native coronary artery without angina pectoris: Secondary | ICD-10-CM | POA: Diagnosis present

## 2023-07-30 DIAGNOSIS — J9601 Acute respiratory failure with hypoxia: Secondary | ICD-10-CM

## 2023-07-30 DIAGNOSIS — Z91128 Patient's intentional underdosing of medication regimen for other reason: Secondary | ICD-10-CM | POA: Diagnosis not present

## 2023-07-30 DIAGNOSIS — E1140 Type 2 diabetes mellitus with diabetic neuropathy, unspecified: Secondary | ICD-10-CM | POA: Diagnosis present

## 2023-07-30 DIAGNOSIS — I21A1 Myocardial infarction type 2: Secondary | ICD-10-CM | POA: Diagnosis present

## 2023-07-30 DIAGNOSIS — G629 Polyneuropathy, unspecified: Secondary | ICD-10-CM

## 2023-07-30 DIAGNOSIS — I513 Intracardiac thrombosis, not elsewhere classified: Secondary | ICD-10-CM | POA: Diagnosis present

## 2023-07-30 DIAGNOSIS — I214 Non-ST elevation (NSTEMI) myocardial infarction: Secondary | ICD-10-CM | POA: Diagnosis not present

## 2023-07-30 DIAGNOSIS — J441 Chronic obstructive pulmonary disease with (acute) exacerbation: Principal | ICD-10-CM

## 2023-07-30 DIAGNOSIS — I5021 Acute systolic (congestive) heart failure: Secondary | ICD-10-CM | POA: Diagnosis present

## 2023-07-30 DIAGNOSIS — I509 Heart failure, unspecified: Secondary | ICD-10-CM | POA: Diagnosis not present

## 2023-07-30 DIAGNOSIS — Z79899 Other long term (current) drug therapy: Secondary | ICD-10-CM | POA: Diagnosis not present

## 2023-07-30 DIAGNOSIS — Z1152 Encounter for screening for COVID-19: Secondary | ICD-10-CM | POA: Diagnosis not present

## 2023-07-30 DIAGNOSIS — E669 Obesity, unspecified: Secondary | ICD-10-CM | POA: Diagnosis present

## 2023-07-30 DIAGNOSIS — I130 Hypertensive heart and chronic kidney disease with heart failure and stage 1 through stage 4 chronic kidney disease, or unspecified chronic kidney disease: Secondary | ICD-10-CM | POA: Diagnosis present

## 2023-07-30 DIAGNOSIS — R0603 Acute respiratory distress: Secondary | ICD-10-CM | POA: Diagnosis present

## 2023-07-30 DIAGNOSIS — I255 Ischemic cardiomyopathy: Secondary | ICD-10-CM | POA: Diagnosis present

## 2023-07-30 DIAGNOSIS — Z7901 Long term (current) use of anticoagulants: Secondary | ICD-10-CM | POA: Diagnosis not present

## 2023-07-30 DIAGNOSIS — G2581 Restless legs syndrome: Secondary | ICD-10-CM | POA: Diagnosis present

## 2023-07-30 LAB — ECHOCARDIOGRAM COMPLETE
AR max vel: 2.46 cm2
AV Area VTI: 2.39 cm2
AV Area mean vel: 2.12 cm2
AV Mean grad: 3 mm[Hg]
AV Peak grad: 4.8 mm[Hg]
Ao pk vel: 1.1 m/s
Area-P 1/2: 7.32 cm2
Height: 59 in
MV VTI: 1.95 cm2
S' Lateral: 4 cm
Single Plane A4C EF: 33.9 %
Weight: 2816 [oz_av]

## 2023-07-30 LAB — CBC
HCT: 48.2 % (ref 39.0–52.0)
Hemoglobin: 16.4 g/dL (ref 13.0–17.0)
MCH: 29.8 pg (ref 26.0–34.0)
MCHC: 34 g/dL (ref 30.0–36.0)
MCV: 87.5 fL (ref 80.0–100.0)
Platelets: 191 10*3/uL (ref 150–400)
RBC: 5.51 MIL/uL (ref 4.22–5.81)
RDW: 14.5 % (ref 11.5–15.5)
WBC: 9.5 10*3/uL (ref 4.0–10.5)
nRBC: 0 % (ref 0.0–0.2)

## 2023-07-30 LAB — BASIC METABOLIC PANEL
Anion gap: 9 (ref 5–15)
BUN: 17 mg/dL (ref 8–23)
CO2: 22 mmol/L (ref 22–32)
Calcium: 8.4 mg/dL — ABNORMAL LOW (ref 8.9–10.3)
Chloride: 107 mmol/L (ref 98–111)
Creatinine, Ser: 1.53 mg/dL — ABNORMAL HIGH (ref 0.61–1.24)
GFR, Estimated: 48 mL/min — ABNORMAL LOW (ref >60)
Glucose, Bld: 176 mg/dL — ABNORMAL HIGH (ref 70–99)
Potassium: 3.6 mmol/L (ref 3.5–5.1)
Sodium: 138 mmol/L (ref 135–145)

## 2023-07-30 LAB — BLOOD GAS, ARTERIAL
Acid-base deficit: 2.7 mmol/L — ABNORMAL HIGH (ref 0.0–2.0)
Bicarbonate: 20.4 mmol/L (ref 20.0–28.0)
Delivery systems: POSITIVE
Expiratory PAP: 5 cm[H2O]
FIO2: 40 %
Inspiratory PAP: 10 cm[H2O]
O2 Saturation: 97.4 %
Patient temperature: 37
pCO2 arterial: 30 mm[Hg] — ABNORMAL LOW (ref 32–48)
pH, Arterial: 7.44 (ref 7.35–7.45)
pO2, Arterial: 81 mm[Hg] — ABNORMAL LOW (ref 83–108)

## 2023-07-30 LAB — COMPREHENSIVE METABOLIC PANEL
ALT: 38 U/L (ref 0–44)
AST: 46 U/L — ABNORMAL HIGH (ref 15–41)
Albumin: 3.6 g/dL (ref 3.5–5.0)
Alkaline Phosphatase: 77 U/L (ref 38–126)
Anion gap: 13 (ref 5–15)
BUN: 16 mg/dL (ref 8–23)
CO2: 21 mmol/L — ABNORMAL LOW (ref 22–32)
Calcium: 8.5 mg/dL — ABNORMAL LOW (ref 8.9–10.3)
Chloride: 104 mmol/L (ref 98–111)
Creatinine, Ser: 1.44 mg/dL — ABNORMAL HIGH (ref 0.61–1.24)
GFR, Estimated: 52 mL/min — ABNORMAL LOW (ref >60)
Glucose, Bld: 189 mg/dL — ABNORMAL HIGH (ref 70–99)
Potassium: 3.4 mmol/L — ABNORMAL LOW (ref 3.5–5.1)
Sodium: 138 mmol/L (ref 135–145)
Total Bilirubin: 1.3 mg/dL — ABNORMAL HIGH (ref 0.3–1.2)
Total Protein: 6.2 g/dL — ABNORMAL LOW (ref 6.5–8.1)

## 2023-07-30 LAB — RESP PANEL BY RT-PCR (RSV, FLU A&B, COVID)  RVPGX2
Influenza A by PCR: NEGATIVE
Influenza B by PCR: NEGATIVE
Resp Syncytial Virus by PCR: NEGATIVE
SARS Coronavirus 2 by RT PCR: NEGATIVE

## 2023-07-30 LAB — TROPONIN I (HIGH SENSITIVITY)
Troponin I (High Sensitivity): 286 ng/L (ref <18)
Troponin I (High Sensitivity): 481 ng/L (ref <18)
Troponin I (High Sensitivity): 793 ng/L (ref <18)
Troponin I (High Sensitivity): 2148 ng/L (ref <18)
Troponin I (High Sensitivity): 1943 ng/L (ref <18)

## 2023-07-30 LAB — CBC WITH DIFFERENTIAL/PLATELET
Abs Immature Granulocytes: 0.05 10*3/uL (ref 0.00–0.07)
Basophils Absolute: 0 10*3/uL (ref 0.0–0.1)
Basophils Relative: 0 %
Eosinophils Absolute: 0.1 10*3/uL (ref 0.0–0.5)
Eosinophils Relative: 1 %
HCT: 46.3 % (ref 39.0–52.0)
Hemoglobin: 15.8 g/dL (ref 13.0–17.0)
Immature Granulocytes: 1 %
Lymphocytes Relative: 10 %
Lymphs Abs: 0.8 10*3/uL (ref 0.7–4.0)
MCH: 30.2 pg (ref 26.0–34.0)
MCHC: 34.1 g/dL (ref 30.0–36.0)
MCV: 88.5 fL (ref 80.0–100.0)
Monocytes Absolute: 0.8 10*3/uL (ref 0.1–1.0)
Monocytes Relative: 10 %
Neutro Abs: 6.2 10*3/uL (ref 1.7–7.7)
Neutrophils Relative %: 78 %
Platelets: 177 10*3/uL (ref 150–400)
RBC: 5.23 MIL/uL (ref 4.22–5.81)
RDW: 14.6 % (ref 11.5–15.5)
WBC: 7.9 10*3/uL (ref 4.0–10.5)
nRBC: 0 % (ref 0.0–0.2)

## 2023-07-30 LAB — HEMOGLOBIN A1C
Hgb A1c MFr Bld: 6.2 % — ABNORMAL HIGH (ref 4.8–5.6)
Mean Plasma Glucose: 131.24 mg/dL

## 2023-07-30 LAB — MAGNESIUM: Magnesium: 2 mg/dL (ref 1.7–2.4)

## 2023-07-30 LAB — PROTIME-INR
INR: 1.2 (ref 0.8–1.2)
Prothrombin Time: 15.4 s — ABNORMAL HIGH (ref 11.4–15.2)

## 2023-07-30 LAB — HEPARIN LEVEL (UNFRACTIONATED)
Heparin Unfractionated: 0.25 [IU]/mL — ABNORMAL LOW (ref 0.30–0.70)
Heparin Unfractionated: 0.37 [IU]/mL (ref 0.30–0.70)

## 2023-07-30 LAB — BRAIN NATRIURETIC PEPTIDE: B Natriuretic Peptide: 1281.2 pg/mL — ABNORMAL HIGH (ref 0.0–100.0)

## 2023-07-30 LAB — APTT: aPTT: 25 s (ref 24–36)

## 2023-07-30 MED ORDER — MAGNESIUM HYDROXIDE 400 MG/5ML PO SUSP: 30.0000 mL | Freq: Every day | ORAL | Status: DC | PRN

## 2023-07-30 MED ORDER — ONDANSETRON HCL 4 MG PO TABS: 4.0000 mg | ORAL_TABLET | Freq: Four times a day (QID) | ORAL | Status: DC | PRN

## 2023-07-30 MED ORDER — METHYLPREDNISOLONE SODIUM SUCC 125 MG IJ SOLR
80.0000 mg | Freq: Every day | INTRAMUSCULAR | Status: DC
  Administered 2023-07-30 – 2023-07-31 (×2): 80 mg via INTRAVENOUS
  Filled 2023-07-30 (×2): qty 2

## 2023-07-30 MED ORDER — POTASSIUM CHLORIDE CRYS ER 10 MEQ PO TBCR
10.0000 meq | EXTENDED_RELEASE_TABLET | Freq: Every day | ORAL | Status: DC
  Administered 2023-07-31 – 2023-08-06 (×6): 10 meq via ORAL
  Filled 2023-07-30 (×6): qty 1

## 2023-07-30 MED ORDER — ROSUVASTATIN CALCIUM 10 MG PO TABS
40.0000 mg | ORAL_TABLET | Freq: Every day | ORAL | Status: DC
  Administered 2023-07-30 – 2023-08-06 (×8): 40 mg via ORAL
  Filled 2023-07-30 (×2): qty 4
  Filled 2023-07-30 (×2): qty 2
  Filled 2023-07-30 (×4): qty 4

## 2023-07-30 MED ORDER — PERFLUTREN LIPID MICROSPHERE
1.0000 mL | INTRAVENOUS | Status: AC | PRN
  Administered 2023-07-30: 5 mL via INTRAVENOUS
  Filled 2023-07-30: qty 10

## 2023-07-30 MED ORDER — GABAPENTIN 300 MG PO CAPS
300.0000 mg | ORAL_CAPSULE | Freq: Three times a day (TID) | ORAL | Status: DC
  Administered 2023-07-30 – 2023-08-02 (×9): 300 mg via ORAL
  Filled 2023-07-30 (×9): qty 1

## 2023-07-30 MED ORDER — ACETAMINOPHEN 325 MG PO TABS
650.0000 mg | ORAL_TABLET | Freq: Four times a day (QID) | ORAL | Status: DC | PRN
  Administered 2023-08-03 (×2): 650 mg via ORAL
  Filled 2023-07-30 (×2): qty 2

## 2023-07-30 MED ORDER — HEPARIN BOLUS VIA INFUSION
1000.0000 [IU] | Freq: Once | INTRAVENOUS | Status: AC
  Administered 2023-07-30: 1000 [IU] via INTRAVENOUS
  Filled 2023-07-30: qty 1000

## 2023-07-30 MED ORDER — ONDANSETRON HCL 4 MG/2ML IJ SOLN
4.0000 mg | Freq: Four times a day (QID) | INTRAMUSCULAR | Status: DC | PRN
  Administered 2023-08-03: 4 mg via INTRAVENOUS
  Filled 2023-07-30: qty 2

## 2023-07-30 MED ORDER — NAPROXEN 500 MG PO TABS: 500.0000 mg | ORAL_TABLET | Freq: Two times a day (BID) | ORAL | Status: DC

## 2023-07-30 MED ORDER — METHYLPREDNISOLONE SODIUM SUCC 125 MG IJ SOLR
125.0000 mg | Freq: Once | INTRAMUSCULAR | Status: AC
  Administered 2023-07-30: 125 mg via INTRAVENOUS
  Filled 2023-07-30: qty 2

## 2023-07-30 MED ORDER — TRAZODONE HCL 50 MG PO TABS
25.0000 mg | ORAL_TABLET | Freq: Every evening | ORAL | Status: DC | PRN
  Administered 2023-08-02 – 2023-08-03 (×2): 25 mg via ORAL
  Filled 2023-07-30 (×2): qty 1

## 2023-07-30 MED ORDER — INDOMETHACIN 25 MG PO CAPS
25.0000 mg | ORAL_CAPSULE | Freq: Two times a day (BID) | ORAL | Status: DC
  Administered 2023-07-30 – 2023-08-02 (×5): 25 mg via ORAL
  Filled 2023-07-30 (×7): qty 1

## 2023-07-30 MED ORDER — IPRATROPIUM-ALBUTEROL 0.5-2.5 (3) MG/3ML IN SOLN
3.0000 mL | RESPIRATORY_TRACT | Status: DC | PRN
  Filled 2023-07-30: qty 3

## 2023-07-30 MED ORDER — GUAIFENESIN 100 MG/5ML PO LIQD: 5.0000 mL | ORAL | Status: DC | PRN

## 2023-07-30 MED ORDER — LISINOPRIL 10 MG PO TABS
10.0000 mg | ORAL_TABLET | Freq: Every day | ORAL | Status: DC
  Administered 2023-07-31 – 2023-08-04 (×5): 10 mg via ORAL
  Filled 2023-07-30 (×5): qty 1

## 2023-07-30 MED ORDER — FUROSEMIDE 10 MG/ML IJ SOLN
40.0000 mg | Freq: Two times a day (BID) | INTRAMUSCULAR | Status: DC
  Administered 2023-07-30 – 2023-07-31 (×4): 40 mg via INTRAVENOUS
  Filled 2023-07-30 (×4): qty 4

## 2023-07-30 MED ORDER — FAMOTIDINE 20 MG PO TABS
20.0000 mg | ORAL_TABLET | Freq: Two times a day (BID) | ORAL | Status: DC
  Administered 2023-07-30 – 2023-08-06 (×14): 20 mg via ORAL
  Filled 2023-07-30 (×14): qty 1

## 2023-07-30 MED ORDER — ASPIRIN 81 MG PO CHEW
324.0000 mg | CHEWABLE_TABLET | Freq: Once | ORAL | Status: AC
  Administered 2023-07-30: 324 mg via ORAL
  Filled 2023-07-30: qty 4

## 2023-07-30 MED ORDER — IPRATROPIUM-ALBUTEROL 0.5-2.5 (3) MG/3ML IN SOLN
3.0000 mL | Freq: Four times a day (QID) | RESPIRATORY_TRACT | Status: DC
  Administered 2023-07-30: 3 mL via RESPIRATORY_TRACT
  Filled 2023-07-30 (×4): qty 3

## 2023-07-30 MED ORDER — HEPARIN BOLUS VIA INFUSION
4000.0000 [IU] | Freq: Once | INTRAVENOUS | Status: AC
  Administered 2023-07-30: 4000 [IU] via INTRAVENOUS
  Filled 2023-07-30: qty 4000

## 2023-07-30 MED ORDER — IPRATROPIUM-ALBUTEROL 0.5-2.5 (3) MG/3ML IN SOLN
3.0000 mL | Freq: Four times a day (QID) | RESPIRATORY_TRACT | Status: DC
  Administered 2023-07-30 (×2): 3 mL via RESPIRATORY_TRACT
  Filled 2023-07-30 (×2): qty 3

## 2023-07-30 MED ORDER — AMLODIPINE BESYLATE 5 MG PO TABS: 5.0000 mg | ORAL_TABLET | Freq: Every day | ORAL | Status: DC

## 2023-07-30 MED ORDER — MAGNESIUM SULFATE 2 GM/50ML IV SOLN
2.0000 g | Freq: Once | INTRAVENOUS | Status: AC
  Administered 2023-07-30: 2 g via INTRAVENOUS
  Filled 2023-07-30: qty 50

## 2023-07-30 MED ORDER — PRIMIDONE 50 MG PO TABS
50.0000 mg | ORAL_TABLET | Freq: Four times a day (QID) | ORAL | Status: DC
  Administered 2023-07-30 – 2023-08-02 (×7): 50 mg via ORAL
  Filled 2023-07-30 (×17): qty 1

## 2023-07-30 MED ORDER — ACETAMINOPHEN 650 MG RE SUPP: 650.0000 mg | Freq: Four times a day (QID) | RECTAL | Status: DC | PRN

## 2023-07-30 MED ORDER — IPRATROPIUM-ALBUTEROL 0.5-2.5 (3) MG/3ML IN SOLN
3.0000 mL | Freq: Once | RESPIRATORY_TRACT | Status: AC
  Administered 2023-07-30: 3 mL via RESPIRATORY_TRACT
  Filled 2023-07-30: qty 3

## 2023-07-30 MED ORDER — ALLOPURINOL 100 MG PO TABS
200.0000 mg | ORAL_TABLET | Freq: Every day | ORAL | Status: DC
  Administered 2023-07-31 – 2023-08-06 (×6): 200 mg via ORAL
  Filled 2023-07-30 (×6): qty 2

## 2023-07-30 MED ORDER — FUROSEMIDE 10 MG/ML IJ SOLN
40.0000 mg | Freq: Once | INTRAMUSCULAR | Status: AC
  Administered 2023-07-30: 40 mg via INTRAVENOUS
  Filled 2023-07-30: qty 4

## 2023-07-30 MED ORDER — METOPROLOL TARTRATE 50 MG PO TABS
100.0000 mg | ORAL_TABLET | Freq: Two times a day (BID) | ORAL | Status: DC
  Administered 2023-07-30 – 2023-08-03 (×8): 100 mg via ORAL
  Filled 2023-07-30 (×8): qty 2

## 2023-07-30 MED ORDER — HEPARIN (PORCINE) 25000 UT/250ML-% IV SOLN
1000.0000 [IU]/h | INTRAVENOUS | Status: DC
  Administered 2023-07-30: 1000 [IU]/h via INTRAVENOUS
  Administered 2023-07-30: 850 [IU]/h via INTRAVENOUS
  Administered 2023-08-01 – 2023-08-03 (×3): 1000 [IU]/h via INTRAVENOUS
  Filled 2023-07-30 (×6): qty 250

## 2023-07-30 MED ORDER — PRAVASTATIN SODIUM 20 MG PO TABS: 40.0000 mg | ORAL_TABLET | Freq: Every day | ORAL | Status: DC

## 2023-07-30 MED ORDER — LISINOPRIL-HYDROCHLOROTHIAZIDE 20-25 MG PO TABS: 1.0000 | ORAL_TABLET | Freq: Every day | ORAL | Status: DC

## 2023-07-30 MED ORDER — HEPARIN (PORCINE) 25000 UT/250ML-% IV SOLN: 14.0000 [IU]/kg/h | INTRAVENOUS | Status: DC

## 2023-07-30 MED ORDER — HEPARIN SODIUM (PORCINE) 5000 UNIT/ML IJ SOLN: 4000.0000 [IU] | Freq: Once | INTRAMUSCULAR | Status: DC

## 2023-07-30 MED ORDER — ENOXAPARIN SODIUM 40 MG/0.4ML IJ SOSY: 40.0000 mg | PREFILLED_SYRINGE | INTRAMUSCULAR | Status: DC

## 2023-07-30 NOTE — Consult Note (Signed)
[facility] CLINIC CARDIOLOGY CONSULT NOTE       Patient ID: Larry [name] MRN: [id number] DOB/AGE: 72-23-1952 72 y.o.  Admit date: 07/29/2023 Referring Physician Valente David, MD Primary Physician Rayetta Humphrey, MD Primary Cardiologist Dorothyann Peng, MD Reason for Consultation NSTEMI  HPI: Larry [name] is a 72 y.o. male  with a past medical history of hyperlipidemia, hypertension, type 2 diabetes mellitus, stroke, neuropathy, RLS, abdominal hernia, GERD who presented to the ED on 07/29/2023 for shortness of breath and cough for 2 weeks. Cardiology was consulted for further evaluation.   Patient presented to the ED from his home due to progressive shortness of breath and cough for 2 weeks. Per the ED provider, patient denied fever, chills, chest pain, abdominal pain, nausea or dizziness. Patient arrived to the ED tachycardic and hypoxic on 4L [place].   Work up in the ED notable for Mg 2.0, Na 138, K 3.4, Cr 1.44, BNP 1,281. Troponin 286 > 481 > 793. EKG showed sinus tachy with PACs, RBBB, rate 133 bpm. CXR with patchy airspace opacities and suspected small bilateral pleural effusions. Patient initially required BiPAP, now placed on 4L [place] with no baseline O2 use. Patient received 2 doses of IV lasix 40 mg and reports good UOP. Patient received 324 mg ASA, heparin infusion started at 0211. Patient has gotten 3 Duoneb treatments for his SOB and wheezing while in the ED.  At the time of my evaluation this morning, patient was resting comfortably in his hospital bed at an incline. Patient states he's been feeling short of breath with exertion for the past 2 weeks. Patient states this is new for him and it's gotten significantly worse. Patient endorses orthopnea and denies chest pain or pressure. Also states he was wheezing a few days ago. Patient is followed by pulmonology outpatient, saw them 02/2023 at which time he had PFTs done which were normal. Has noticed some LE edema as well.  Review  of systems complete and found to be negative unless listed above    Past Medical History:  Diagnosis Date   Hypertension     History reviewed. No pertinent surgical history.  (Not in a hospital admission)  Social History   Socioeconomic History   Marital status: Married    Spouse name: Not on file   Number of children: Not on file   Years of education: Not on file   Highest education level: Not on file  Occupational History   Not on file  Tobacco Use   Smoking status: Never   Smokeless tobacco: Current  Substance and Sexual Activity   Alcohol use: Yes   Drug use: Never   Sexual activity: Not on file  Other Topics Concern   Not on file  Social History Narrative   Not on file   Social Determinants of Health   Financial Resource Strain: Medium Risk (01/20/2023)   Received from [facility]   Overall Financial Resource Strain (CARDIA)    Difficulty of Paying Living Expenses: Somewhat hard  Food Insecurity: Food Insecurity Present (01/20/2023)   Received from [facility]   Hunger Vital Sign    Worried About Running Out of Food in the Last Year: Often true    Ran Out of Food in the Last Year: Often true  Transportation Needs: No Transportation Needs (01/20/2023)   Received from [facility]   PRAPARE - Transportation    In the past 12 months, has lack of  transportation kept you from medical appointments or from getting medications?: No    Lack of Transportation (Non-Medical): No  Physical Activity: Not on file  Stress: Not on file  Social Connections: Not on file  Intimate Partner Violence: Not on file    History reviewed. No pertinent family history.   Vitals:   07/30/23 1205 07/30/23 1330 07/30/23 1413 07/30/23 1430  BP: (!) 154/103 (!) 129/102  (!) 149/107  Pulse: (!) 109 (!) 108  (!) 108  Resp: 19 20  19   Temp:   98.4 F (36.9 C)   TempSrc:   Oral   SpO2: 91% 93%  95%  Weight:      Height:         PHYSICAL EXAM General: Elderly appearing male, well nourished, in no acute distress laying in his hospital bed at an incline. HEENT: Normocephalic and atraumatic. Neck: No JVD.   Lungs: Normal respiratory effort on 4L (baseline is RA). Clear bilaterally to auscultation. No wheezes. Mild crackles bilaterally. Heart: Regular rhythm, fast rate. Normal S1 and S2 without gallops or murmurs.  Abdomen: Non-distended appearing.  Msk: Normal strength and tone for age. Extremities: Warm and well perfused. No clubbing, cyanosis. 1+ pitting edema.  Neuro: Alert and oriented X 3. Psych: Answers questions appropriately.   Labs: Basic Metabolic Panel: Recent Labs    07/30/23 0040 07/30/23 0426  NA 138 138  K 3.4* 3.6  CL 104 107  CO2 21* 22  GLUCOSE 189* 176*  BUN 16 17  CREATININE 1.44* 1.53*  CALCIUM 8.5* 8.4*  MG 2.0  --    Liver Function Tests: Recent Labs    07/30/23 0040  AST 46*  ALT 38  ALKPHOS 77  BILITOT 1.3*  PROT 6.2*  ALBUMIN 3.6   No results for input(s): "LIPASE", "AMYLASE" in the last 72 hours. CBC: Recent Labs    07/30/23 0040 07/30/23 0426  WBC 7.9 9.5  NEUTROABS 6.2  --   HGB 15.8 16.4  HCT 46.3 48.2  MCV 88.5 87.5  PLT 177 191   Cardiac Enzymes: Recent Labs    07/30/23 0040 07/30/23 0201 07/30/23 0426  TROPONINIHS 286* 481* 793*   BNP: Recent Labs    07/30/23 0040  BNP 1,281.2*   D-Dimer: No results for input(s): "DDIMER" in the last 72 hours. Hemoglobin A1C: No results for input(s): "HGBA1C" in the last 72 hours. Fasting Lipid Panel: No results for input(s): "CHOL", "HDL", "LDLCALC", "TRIG", "CHOLHDL", "LDLDIRECT" in the last 72 hours. Thyroid Function Tests: No results for input(s): "TSH", "T4TOTAL", "T3FREE", "THYROIDAB" in the last 72 hours.  Invalid input(s): "FREET3" Anemia Panel: No results for input(s): "VITAMINB12", "FOLATE", "FERRITIN", "TIBC", "IRON", "RETICCTPCT" in the last 72 hours.   Radiology: ECHOCARDIOGRAM  COMPLETE  Result Date: 07/30/2023    ECHOCARDIOGRAM REPORT   Patient Name:   Larry [name] Date of Exam: 07/30/2023 Medical Rec #:  [id number]          Height:       59.0 in Accession #:    [id number]         Weight:       176.0 lb Date of Birth:  Jun 03, 1951          BSA:          1.747 m Patient Age:    72 years           BP:           124/102 mmHg Patient Gender: M  HR:           118 bpm. Exam Location:  [facility] Procedure: 2D Echo, Cardiac Doppler, Color Doppler and Intracardiac            Opacification Agent Indications:     CHF  History:         Patient has no prior history of Echocardiogram examinations.                  CHF, Acute MI, COPD; Risk Factors:Dyslipidemia.  Sonographer:     Mikki Harbor Referring Phys:  [id number] JAN A MANSY Diagnosing Phys: Julien Nordmann MD  Sonographer Comments: Image acquisition challenging due to COPD. IMPRESSIONS  1. Left ventricular ejection fraction, by estimation, is 30 to 35%. Left ventricular ejection fraction by PLAX is 32 %. The left ventricle has moderately decreased function. The left ventricle demonstrates global hypokinesis. The left ventricular internal cavity size was mildly dilated. Left ventricular diastolic parameters are indeterminate.  2. Right ventricular systolic function is normal. The right ventricular size is normal. Tricuspid regurgitation signal is inadequate for assessing PA pressure. The estimated right ventricular systolic pressure is 17.4 mmHg.  3. The mitral valve is normal in structure. Mild to moderate mitral valve regurgitation. No evidence of mitral stenosis.  4. The aortic valve is normal in structure. Aortic valve regurgitation is mild. No aortic stenosis is present.  5. The inferior vena cava is normal in size with greater than 50% respiratory variability, suggesting right atrial pressure of 3 mmHg. FINDINGS  Left Ventricle: Left ventricular ejection fraction, by estimation, is 30 to 35%. Left ventricular ejection  fraction by PLAX is 32 %. The left ventricle has moderately decreased function. The left ventricle demonstrates global hypokinesis. Definity contrast agent was given IV to delineate the left ventricular endocardial borders. The left ventricular internal cavity size was mildly dilated. There is no left ventricular hypertrophy. Left ventricular diastolic parameters are indeterminate. Right Ventricle: The right ventricular size is normal. No increase in right ventricular wall thickness. Right ventricular systolic function is normal. Tricuspid regurgitation signal is inadequate for assessing PA pressure. The tricuspid regurgitant velocity is 1.90 m/s, and with an assumed right atrial pressure of 3 mmHg, the estimated right ventricular systolic pressure is 17.4 mmHg. Left Atrium: Left atrial size was normal in size. Right Atrium: Right atrial size was normal in size. Pericardium: There is no evidence of pericardial effusion. Mitral Valve: The mitral valve is normal in structure. Mild to moderate mitral valve regurgitation. No evidence of mitral valve stenosis. MV peak gradient, 5.5 mmHg. The mean mitral valve gradient is 2.0 mmHg. Tricuspid Valve: The tricuspid valve is normal in structure. Tricuspid valve regurgitation is mild . No evidence of tricuspid stenosis. Aortic Valve: The aortic valve is normal in structure. Aortic valve regurgitation is mild. No aortic stenosis is present. Aortic valve mean gradient measures 3.0 mmHg. Aortic valve peak gradient measures 4.8 mmHg. Aortic valve area, by VTI measures 2.39 cm. Pulmonic Valve: The pulmonic valve was normal in structure. Pulmonic valve regurgitation is not visualized. No evidence of pulmonic stenosis. Aorta: The aortic root is normal in size and structure. Venous: The inferior vena cava is normal in size with greater than 50% respiratory variability, suggesting right atrial pressure of 3 mmHg. IAS/Shunts: No atrial level shunt detected by color flow Doppler.  LEFT  VENTRICLE PLAX 2D LV EF:         Left            Diastology  ventricular     LV e' medial:    10.80 cm/s                ejection        LV E/e' medial:  9.1                fraction by     LV e' lateral:   9.14 cm/s                PLAX is 32      LV E/e' lateral: 10.8                %. LVIDd:         4.70 cm LVIDs:         4.00 cm LV PW:         1.30 cm LV IVS:        1.20 cm LVOT diam:     2.00 cm LV SV:         43 LV SV Index:   25 LVOT Area:     3.14 cm  LV Volumes (MOD) LV vol d, MOD    96.6 ml A4C: LV vol s, MOD    63.9 ml A4C: LV SV MOD A4C:   96.6 ml RIGHT VENTRICLE RV Basal diam:  3.20 cm RV Mid diam:    2.80 cm RV S prime:     12.60 cm/s LEFT ATRIUM             Index        RIGHT ATRIUM           Index LA diam:        3.80 cm 2.18 cm/m   RA Area:     12.80 cm LA Vol (A2C):   47.7 ml 27.31 ml/m  RA Volume:   33.40 ml  19.12 ml/m LA Vol (A4C):   53.4 ml 30.57 ml/m LA Biplane Vol: 51.4 ml 29.42 ml/m  AORTIC VALVE                    PULMONIC VALVE AV Area (Vmax):    2.46 cm     PV Vmax:       0.75 m/s AV Area (Vmean):   2.12 cm     PV Peak grad:  2.3 mmHg AV Area (VTI):     2.39 cm AV Vmax:           109.50 cm/s AV Vmean:          81.750 cm/s AV VTI:            0.180 m AV Peak Grad:      4.8 mmHg AV Mean Grad:      3.0 mmHg LVOT Vmax:         85.67 cm/s LVOT Vmean:        55.067 cm/s LVOT VTI:          0.136 m LVOT/AV VTI ratio: 0.76  AORTA Ao Root diam: 3.30 cm MITRAL VALVE               TRICUSPID VALVE MV Area (PHT): 7.32 cm    TR Peak grad:   14.4 mmHg MV Area VTI:   1.95 cm    TR Vmax:        190.00 cm/s MV Peak grad:  5.5 mmHg MV Mean grad:  2.0 mmHg    SHUNTS MV Vmax:       1.17  m/s    Systemic VTI:  0.14 m MV Vmean:      65.6 cm/s   Systemic Diam: 2.00 cm MV Decel Time: 104 msec MV E velocity: 98.38 cm/s MV A velocity: 84.35 cm/s MV E/A ratio:  1.17 Julien Nordmann MD Electronically signed by Julien Nordmann MD Signature Date/Time: 07/30/2023/1:36:14 PM    Final    DG Chest 1  View  Result Date: 07/30/2023 CLINICAL DATA:  Shortness of breath EXAM: CHEST  1 VIEW COMPARISON:  05/19/2014 FINDINGS: Low lung volumes accentuate pulmonary vascularity. Normal cardiomediastinal silhouette. Patchy bilateral airspace opacities. Retrocardiac atelectasis or consolidation. Suspected small bilateral pleural effusions. No pneumothorax. No displaced rib fractures. IMPRESSION: Patchy airspace opacities may be due to atelectasis or pneumonia. Suspected small bilateral pleural effusions. Electronically Signed   By: Minerva Fester M.D.   On: 07/30/2023 02:30    ECHO As above (EF 30-35%)  TELEMETRY reviewed by me 07/30/2023: Sinus tachy, rate 110s  EKG reviewed by me: Sinus tachy with PACs, RBBB, rate 133 bpm (non-acute)  Pertinent Cardiac History CT Coronary 11/24/2022: Aorta normal caliber without dissection. Atherosclerotic calcifications descending thoracic aorta. No pericardial effusion. Visualized pulmonary arteries patent.  Stress Test 08/07/2022:  Abnormal myocardial perfusion scar large area of anterior apical inferior apical defect with significant reversibility consistent with ischemia overall left ventricular function is normal around 59% no clear wall motion abnormality study is consistent with ischemia this is a  high risk scan recommend further evaluation invasively. Regional wall motion reveals normal myocardial thickening and wall motion. LVEF 59%  Data reviewed by me 07/30/2023: last 24h vitals tele labs imaging I/O ED provider note, admission H&P.  Principal Problem:   Acute respiratory failure with hypoxia (HCC) Active Problems:   Non-STEMI (non-ST elevated myocardial infarction) (HCC)   Dyslipidemia   Peripheral neuropathy   COPD exacerbation (HCC)   Acute congestive heart failure (HCC)   Acute HFrEF (heart failure with reduced ejection fraction) (HCC)   Chronic kidney disease, stage 3a (HCC)    ASSESSMENT AND PLAN:  Larry [name] is a 72 y.o. male   with a past medical history of hyperlipidemia, hypertension, type 2 diabetes mellitus, stroke, neuropathy, RLS, abdominal hernia, GERD who presented to the ED on 07/29/2023 for shortness of breath and cough for 2 weeks. Cardiology was consulted for further evaluation.   # Acute respiratory failure with hypoxia  # Acute HFrEF (EF 30-35%) #Hypertension Upon admission patient reports worsening shortness of breath for past 2 weeks. Patient states he can't walk without getting SOB and this is new for him. BNP 1,281. Patient was placed on 4L [place] and not on O2 at baseline, did require BiPAP initially in the ED. Echo this admission with newly reduced EF to 30-35%. Patient received 2 doses of IV 40 mg lasix and reports good UOP.  -Plan to continue diuresis with IV lasix 40 mg bid. -Home metoprolol tartrate 100 mg bid reordered, consider switching to succinate.  -Lisinopril ordered to start tomorrow by primary. Discontinue amlodipine given reduced EF.  -Do not suspect COPD exacerbation given normal PFTs 02/2023.  #NSTEMI #Hyperlipidemia Patient reporting worsening DOE for 2 weeks. Denies chest pain or pressure. Upon admission patient has been tachycardiac with blood pressure slightly elevated. Troponin 286 >481 > 793. Patient received 324 mg ASA. EKG showed sinus tachy with PACs, RBBB, rate 133 bpm, which is non-acute. Most recent lipid panel 06/2023 with TC 109 and LDL 46. -Continue to trend troponin. -Continue heparin drip, manage per pharmacy.  -  Continue rosuvastatin 40 mg   #Chronic kidney disease 3a Upon admission, Cr 1.44 and now Cr 1.53. Cr baseline around 1.4-1.5S/p 2 doses of IV lasix 40 mg with net negative -1.38 L. -Monitor renal function closely with diureses.  -Monitor electrolytes, strict I's & O's, daily weights      This patient's plan of care was discussed and created with Dr. Corky Sing and he is in agreement.  Signed: Gale Journey, PA-C  07/30/2023, 3:38 PM [facility]  Cardiology

## 2023-07-30 NOTE — ED Provider Notes (Signed)
[facility] Provider Note    Event Date/Time   First MD Initiated Contact with Patient 07/30/23 0001     (approximate)   History   Shortness of Breath   HPI  Larry [name] is a 72 y.o. male who presents to the ED from home with a chief complaint of cough and shortness of breath x 1 to 2 weeks. Denies fever/chills, chest pain, abdominal pain, nausea, vomiting or dizziness. History of COPD not on home oxygen. Arrives to the ED tachycardic and hypoxic requiring 4L [place] oxygen.     Past Medical History   Past Medical History:  Diagnosis Date   Hypertension      Active Problem List   Patient Active Problem List   Diagnosis Date Noted   Acute respiratory failure with hypoxia (HCC) 07/30/2023     Past Surgical History  History reviewed. No pertinent surgical history.   Home Medications   Prior to Admission medications   Medication Sig Start Date End Date Taking? Authorizing Provider  albuterol (VENTOLIN HFA) 108 (90 Base) MCG/ACT inhaler Inhale 2 puffs into the lungs every 4 (four) hours as needed for wheezing or shortness of breath.    [provider]  allopurinol (ZYLOPRIM) 100 MG tablet Take 200 mg by mouth daily.    [provider]  amLODipine (NORVASC) 5 MG tablet Take 1 tablet (5 mg total) by mouth daily. 05/20/19 06/19/19  Orvil Feil, PA-C  famotidine (PEPCID) 20 MG tablet Take 20 mg by mouth 2 (two) times daily.    [provider]  gabapentin (NEURONTIN) 300 MG capsule Take 300 mg by mouth 3 (three) times daily.    [provider]  indomethacin (INDOCIN) 25 MG capsule Take 25 mg by mouth 2 (two) times daily with a meal.    [provider]  lisinopril-hydrochlorothiazide (ZESTORETIC) 20-25 MG tablet Take 1 tablet by mouth daily.    [provider]  lovastatin (MEVACOR) 40 MG tablet Take 40 mg by mouth at bedtime.    [provider]  metoprolol tartrate (LOPRESSOR) 100 MG  tablet Take 100 mg by mouth 2 (two) times daily.    [provider]  naproxen (NAPROSYN) 500 MG tablet Take 1 tablet (500 mg total) by mouth 2 (two) times daily with a meal. 09/26/19   Triplett, Cari B, FNP  potassium chloride (K-DUR) 10 MEQ tablet Take 10 mEq by mouth daily.    [provider]  primidone (MYSOLINE) 50 MG tablet Take by mouth 4 (four) times daily.    [provider]     Allergies  Patient has no known allergies.   Family History  History reviewed. No pertinent family history.   Physical Exam  Triage Vital Signs: ED Triage Vitals  Encounter Vitals Group     BP 07/29/23 2353 (!) 175/127     Systolic BP Percentile --      Diastolic BP Percentile --      Pulse Rate 07/29/23 2353 (!) 138     Resp 07/29/23 2353 (!) 25     Temp 07/29/23 2353 98.4 F (36.9 C)     Temp Source 07/29/23 2353 Oral     SpO2 07/29/23 2353 (!) 87 %     Weight 07/29/23 2352 176 lb (79.8 kg)     Height 07/29/23 2352 4\' 11"  (1.499 m)     Head Circumference --      Peak Flow --      Pain  Score 07/29/23 2352 0     Pain Loc --      Pain Education --      Exclude from Growth Chart --     Updated Vital Signs: BP (!) 175/127   Pulse (!) 138   Temp 98.4 F (36.9 C) (Oral)   Resp (!) 25   Ht 4\' 11"  (1.499 m)   Wt 79.8 kg   SpO2 (!) 84%   BMI 35.55 kg/m    General: Awake, moderate distress.  CV:  Tachycardic. Good peripheral perfusion.  Resp:  Increased effort. Tripoding. Diminished aeration bilaterally. Abd:  Nontender. No distention.  Other:  No edema.   ED Results / Procedures / Treatments  Labs (all labs ordered are listed, but only abnormal results are displayed) Labs Reviewed  COMPREHENSIVE METABOLIC PANEL - Abnormal; Notable for the following components:      Result Value   Potassium 3.4 (*)    CO2 21 (*)    Glucose, Bld 189 (*)    Creatinine, Ser 1.44 (*)    Calcium 8.5 (*)    Total Protein 6.2 (*)    AST 46 (*)    Total Bilirubin 1.3 (*)     GFR, Estimated 52 (*)    All other components within normal limits  BRAIN NATRIURETIC PEPTIDE - Abnormal; Notable for the following components:   B Natriuretic Peptide 1,281.2 (*)    All other components within normal limits  BLOOD GAS, ARTERIAL - Abnormal; Notable for the following components:   pCO2 arterial 30 (*)    pO2, Arterial 81 (*)    Acid-base deficit 2.7 (*)    All other components within normal limits  TROPONIN I (HIGH SENSITIVITY) - Abnormal; Notable for the following components:   Troponin I (High Sensitivity) 286 (*)    All other components within normal limits  RESP PANEL BY RT-PCR (RSV, FLU A&B, COVID)  [id number]  CBC WITH DIFFERENTIAL/PLATELET  MAGNESIUM  TROPONIN I (HIGH SENSITIVITY)     EKG  ED ECG REPORT I, Charitie Hinote J, the attending physician, personally viewed and interpreted this ECG.   Date: 07/30/2023  EKG Time: 2357  Rate: 133  Rhythm: sinus tachycardia  Axis: Normal  Intervals:none  ST&T Change: Nonspecific    RADIOLOGY I have independently visualized and interpreted patient's imaging study as well as noted the radiology interpretation:  CXR wet read: Volume overload  Official radiology report(s): No results found.   PROCEDURES:  Critical Care performed: Yes, see critical care procedure note(s)  CRITICAL CARE Performed by: Irean Hong   Total critical care time: 45 minutes  Critical care time was exclusive of separately billable procedures and treating other patients.  Critical care was necessary to treat or prevent imminent or life-threatening deterioration.  Critical care was time spent personally by me on the following activities: development of treatment plan with patient and/or surrogate as well as nursing, discussions with consultants, evaluation of patient's response to treatment, examination of patient, obtaining history from patient or surrogate, ordering and performing treatments and interventions, ordering and review of  laboratory studies, ordering and review of radiographic studies, pulse oximetry and re-evaluation of patient's condition.   Marland Kitchen1-3 Lead EKG Interpretation  Performed by: Irean Hong, MD Authorized by: Irean Hong, MD     Interpretation: abnormal     ECG rate:  135   ECG rate assessment: tachycardic     Rhythm: sinus tachycardia     Ectopy: none     Conduction: normal  Comments:     Placed on cardiac monitor to evaluate for arrthymias    MEDICATIONS ORDERED IN ED: Medications  magnesium sulfate IVPB 2 g 50 mL (2 g Intravenous New Bag/Given 07/30/23 0059)  furosemide (LASIX) injection 40 mg (has no administration in time range)  aspirin chewable tablet 324 mg (has no administration in time range)  heparin ADULT infusion 100 units/mL (25000 units/2101mL) (has no administration in time range)  heparin injection 4,000 Units (has no administration in time range)  ipratropium-albuterol (DUONEB) 0.5-2.5 (3) MG/3ML nebulizer solution 3 mL (3 mLs Nebulization Given 07/30/23 0030)  methylPREDNISolone sodium succinate (SOLU-MEDROL) 125 mg/2 mL injection 125 mg (125 mg Intravenous Given 07/30/23 0052)     IMPRESSION / MDM / ASSESSMENT AND PLAN / ED COURSE  I reviewed the triage vital signs and the nursing notes.                             72 year old male presenting with respiratory distress and hypoxia. Differential includes, but is not limited to, viral syndrome, bronchitis including COPD exacerbation, pneumonia, reactive airway disease including asthma, CHF including exacerbation with or without pulmonary/interstitial edema, pneumothorax, ACS, thoracic trauma, and pulmonary embolism. I have personally reviewed patient's records and note a cardiology office note from 06/29/2023.  Patient's presentation is most consistent with acute presentation with potential threat to life or bodily function.  The patient is on the cardiac monitor to evaluate for evidence of arrhythmia and/or significant  heart rate changes.  Will place patient on BiPAP, obtain cardiac panel, cxr. IV Solumedrol, duoneb, Magnesium. Anticipate hospitalization.  Clinical Course as of 07/30/23 0146  Thu Jul 30, 2023  7829 Looking better on BiPAP, tolerating well. [JS]  0140 BP 149/104. Elevated BNP and volume overloaded on cxr. No prior h/o CHF. Will add Lasix, ASA. Will consult hospitalist service for evaluation and admission. [JS]  0145 Elevated troponin noted. Will initiate heparin bolus with gtt. [JS]    Clinical Course User Index [JS] Irean Hong, MD     FINAL CLINICAL IMPRESSION(S) / ED DIAGNOSES   Final diagnoses:  COPD exacerbation (HCC)  Respiratory distress  Hypoxia  Acute congestive heart failure, unspecified heart failure type (HCC)  Elevated troponin  NSTEMI (non-ST elevated myocardial infarction) (HCC)     Rx / DC Orders   ED Discharge Orders     None        Note:  This document was prepared using Dragon voice recognition software and may include unintentional dictation errors.   Irean Hong, MD 07/30/23 [phone]

## 2023-07-30 NOTE — ED Notes (Signed)
PT HR returned to baseline. Pt states "I feel like I am breathing better now." RR regular and unlabored.

## 2023-07-30 NOTE — Progress Notes (Signed)
ANTICOAGULATION CONSULT NOTE  Pharmacy Consult for heparin infusion Indication: ACS/STEMI  No Known Allergies  Patient Measurements: Height: 4\' 11"  (149.9 cm) Weight: 79.8 kg (176 lb) IBW/kg (Calculated) : 47.7 Heparin Dosing Weight: 65.7 kg  Vital Signs: Temp: 97.7 F (36.5 C) (10/31 0859) Temp Source: Axillary (10/31 0859) BP: 144/85 (10/31 1030) Pulse Rate: 110 (10/31 1030)  Labs: Recent Labs    07/30/23 0040 07/30/23 0201 07/30/23 0426  HGB 15.8  --  16.4  HCT 46.3  --  48.2  PLT 177  --  191  APTT  --  25  --   LABPROT  --  15.4*  --   INR  --  1.2  --   CREATININE 1.44*  --  1.53*  TROPONINIHS 286* 481*  --     Estimated Creatinine Clearance: 37.3 mL/min (A) (by C-G formula based on SCr of 1.53 mg/dL (H)).   Medical History: Past Medical History:  Diagnosis Date   Hypertension     Assessment: Pt is a 72 yo male w/ PMH of HTN, COPD presenting to ED c/o SOB & cough x 1 week, found with elevated BNP & Troponin I level.  Goal of Therapy:  Heparin level 0.3-0.7 units/ml Monitor platelets by anticoagulation protocol: Yes   Plan: heparin level subtherapeutic ---Bolus heparin 1000 units IV x 1 ---increase heparin infusion rate to 1000 units/hr ---Will check heparin level in 8 hrs after rate change ---CBC once daily while on heparin  Burnis Medin, PharmD, BCPS 07/30/2023 10:36 AM

## 2023-07-30 NOTE — Progress Notes (Signed)
*  PRELIMINARY RESULTS* Echocardiogram 2D Echocardiogram has been performed.  [name] 07/30/2023, 10:47 AM

## 2023-07-30 NOTE — ED Notes (Signed)
Lorene Dy M.D. made aware of pt's trop of 286

## 2023-07-30 NOTE — Progress Notes (Signed)
BiPAP removed pt placed on 4Lnc. Pt tol well. Will cont to monitor

## 2023-07-30 NOTE — H&P (Signed)
[facility]   PATIENT NAME: Larry [name]    MR#:  [id number]  DATE OF BIRTH:  [birth year]  DATE OF ADMISSION:  07/29/2023  PRIMARY CARE PHYSICIAN: Rayetta Humphrey, MD   Patient is coming from: Home  REQUESTING/REFERRING PHYSICIAN: Chiquita Loth, MD  CHIEF COMPLAINT:   Chief Complaint  Patient presents with   Shortness of Breath    HISTORY OF PRESENT ILLNESS:  Larry [name] is a 72 y.o. Caucasian male with medical history significant for essential hypertension, and COPD who presented to the emergency room with acute onset of worsening dyspnea with associated cough productive of clear sputum over the last couple weeks with no fever or chills.  He denies any nausea or vomiting or abdominal pain.  No chest pain or palpitations.  No dysuria, oliguria or hematuria or flank pain.  He admits to orthopnea and paroxysmal nocturnal dyspnea as well as dyspnea on exertion.  He denied any worsening lower extremity edema.  ED Course: When the patient came to the ER, BP was 175/127 with heart rate of 138 and respiratory to 25 approximately 87% on room air and 94 to 95% on BiPAP.  Labs revealed blood gas with pH of 7.44 and pCO2 of 30 with pO2 of 81 and O2 sat of 97.4% with bicarbonate of 20.  CBC showed mild hypokalemia of 3.4 and blood glucose of 189 with CO2 of 21 and AST 46 total protein 6.2 total bili 1.3 and BUN and creatinine were 16 and 1.44 above previous levels in 2020.  I sensitive troponin I was 286 and later 481 with a BNP of 1281.2.  CBC was within normal.  INR is 1.2 and PT 15.4 with PTT of 25.  Respiratory panel came back negative EKG as reviewed by me : EKG showed sinus tachycardia with rate 133 with premature supraventricular complexes and right bundle branch block with Q waves in through septally and T wave inversion inferiorly and laterally. Imaging: Portable chest x-ray showed patchy airspace opacity may be due to atelectasis or pneumonia with suspected small bilateral  pleural effusions.  The patient was given 40 mg of IV Lasix 125 mg of IV Solu-Medrol 2 g of IV magnesium sulfate and DuoNeb.  He was later started on IV heparin with increasing troponin I.  He will be admitted to a progressive unit bed for further evaluation and management. PAST MEDICAL HISTORY:   Past Medical History:  Diagnosis Date   Hypertension     PAST SURGICAL HISTORY:  History reviewed. No pertinent surgical history.  SOCIAL HISTORY:   Social History   Tobacco Use   Smoking status: Never   Smokeless tobacco: Current  Substance Use Topics   Alcohol use: Yes    FAMILY HISTORY:  History reviewed. No pertinent family history.  DRUG ALLERGIES:  No Known Allergies  REVIEW OF SYSTEMS:   ROS As per history of present illness. All pertinent systems were reviewed above. Constitutional, HEENT, cardiovascular, respiratory, GI, GU, musculoskeletal, neuro, psychiatric, endocrine, integumentary and hematologic systems were reviewed and are otherwise negative/unremarkable except for positive findings mentioned above in the HPI.   MEDICATIONS AT HOME:   Prior to Admission medications   Medication Sig Start Date End Date Taking? Authorizing Provider  albuterol (VENTOLIN HFA) 108 (90 Base) MCG/ACT inhaler Inhale 2 puffs into the lungs every 4 (four) hours as needed for wheezing or shortness of breath.    [provider]  allopurinol (ZYLOPRIM) 100 MG tablet Take 200  mg by mouth daily.    [provider]  amLODipine (NORVASC) 5 MG tablet Take 1 tablet (5 mg total) by mouth daily. 05/20/19 06/19/19  Orvil Feil, PA-C  famotidine (PEPCID) 20 MG tablet Take 20 mg by mouth 2 (two) times daily.    [provider]  gabapentin (NEURONTIN) 300 MG capsule Take 300 mg by mouth 3 (three) times daily.    [provider]  indomethacin (INDOCIN) 25 MG capsule Take 25 mg by mouth 2 (two) times daily with a meal.    [provider]   lisinopril-hydrochlorothiazide (ZESTORETIC) 20-25 MG tablet Take 1 tablet by mouth daily.    [provider]  lovastatin (MEVACOR) 40 MG tablet Take 40 mg by mouth at bedtime.    [provider]  metoprolol tartrate (LOPRESSOR) 100 MG tablet Take 100 mg by mouth 2 (two) times daily.    [provider]  naproxen (NAPROSYN) 500 MG tablet Take 1 tablet (500 mg total) by mouth 2 (two) times daily with a meal. 09/26/19   Triplett, Cari B, FNP  potassium chloride (K-DUR) 10 MEQ tablet Take 10 mEq by mouth daily.    [provider]  primidone (MYSOLINE) 50 MG tablet Take by mouth 4 (four) times daily.    [provider]      VITAL SIGNS:  Blood pressure (!) 140/103, pulse 98, temperature 98.4 F (36.9 C), temperature source Oral, resp. rate (!) 23, height 4\' 11"  (1.499 m), weight 79.8 kg, SpO2 95%.  PHYSICAL EXAMINATION:  Physical Exam  GENERAL: Acutely ill 72 y.o.-year-old patient lying in the bed with mild respiratory distress with conversational dyspnea on BiPAP. EYES: Pupils equal, round, reactive to light and accommodation. No scleral icterus. Extraocular muscles intact.  HEENT: Head atraumatic, normocephalic. Oropharynx and nasopharynx clear.  NECK:  Supple, no jugular venous distention. No thyroid enlargement, no tenderness.  LUNGS: Number bibasal breath sounds with bibasal rales as well as extreme wheezes with diminished expiratory airflow and harsh vesicular breathing..  No use of accessory muscles of respiration.  CARDIOVASCULAR: Regular rate and rhythm, S1, S2 normal. No murmurs, rubs, or gallops.  ABDOMEN: Soft, nondistended, nontender. Bowel sounds present. No organomegaly or mass.  EXTREMITIES: No pedal edema, cyanosis, or clubbing.  NEUROLOGIC: Cranial nerves II through XII are intact. Muscle strength 5/5 in all extremities. Sensation intact. Gait not checked.  PSYCHIATRIC: The patient is alert and oriented x 3.  Normal affect and good  eye contact. SKIN: No obvious rash, lesion, or ulcer.   LABORATORY PANEL:   CBC Recent Labs  Lab 07/30/23 0426  WBC 9.5  HGB 16.4  HCT 48.2  PLT 191   ------------------------------------------------------------------------------------------------------------------  Chemistries  Recent Labs  Lab 07/30/23 0040 07/30/23 0426  NA 138 138  K 3.4* 3.6  CL 104 107  CO2 21* 22  GLUCOSE 189* 176*  BUN 16 17  CREATININE 1.44* 1.53*  CALCIUM 8.5* 8.4*  MG 2.0  --   AST 46*  --   ALT 38  --   ALKPHOS 77  --   BILITOT 1.3*  --    ------------------------------------------------------------------------------------------------------------------  Cardiac Enzymes No results for input(s): "TROPONINI" in the last 168 hours. ------------------------------------------------------------------------------------------------------------------  RADIOLOGY:  DG Chest 1 View  Result Date: 07/30/2023 CLINICAL DATA:  Shortness of breath EXAM: CHEST  1 VIEW COMPARISON:  05/19/2014 FINDINGS: Low lung volumes accentuate pulmonary vascularity. Normal cardiomediastinal silhouette. Patchy bilateral airspace opacities. Retrocardiac atelectasis or consolidation. Suspected small bilateral pleural effusions. No pneumothorax.  No displaced rib fractures. IMPRESSION: Patchy airspace opacities may be due to atelectasis or pneumonia. Suspected small bilateral pleural effusions. Electronically Signed   By: Minerva Fester M.D.   On: 07/30/2023 02:30      IMPRESSION AND PLAN:  Assessment and Plan: * Acute respiratory failure with hypoxia (HCC) - This is due to COPD exacerbation and acute CHF likely diastolic. - The patient will be admitted to a progressive unit bed. - We will continue him on BiPAP. - We will continue steroid therapy with IV Solu-Medrol. - We will continue bronchodilator therapy with DuoNebs 4 times daily and every 4 hours as needed. - We will continue diuresis with IV Lasix. - 2D echo be  obtained as well as cardiology consult. - Dr. Corky Sing was notified about the patient.  Non-STEMI (non-ST elevated myocardial infarction) (HCC) - The patient be placed on IV heparin. - High-dose statin will be continued - We will continue beta-blocker therapy. - Cardiology consult and 2D echo be obtained as mentioned above.  Peripheral neuropathy - We will need Neurontin.  Dyslipidemia - We will continue statin therapy. - Fasting lipids will be obtained.    DVT prophylaxis: IV heparin Advanced Care Planning:  Code Status: full code. Family Communication:  The plan of care was discussed in details with the patient (and family). I answered all questions. The patient agreed to proceed with the above mentioned plan. Further management will depend upon hospital course. Disposition Plan: Back to previous home environment Consults called: Cardiology All the records are reviewed and case discussed with ED provider.  Status is: Inpatient   At the time of the admission, it appears that the appropriate admission status for this patient is inpatient.  This is judged to be reasonable and necessary in order to provide the required intensity of service to ensure the patient's safety given the presenting symptoms, physical exam findings and initial radiographic and laboratory data in the context of comorbid conditions.  The patient requires inpatient status due to high intensity of service, high risk of further deterioration and high frequency of surveillance required.  I certify that at the time of admission, it is my clinical judgment that the patient will require inpatient hospital care extending more than 2 midnights.                            Dispo: The patient is from: Home              Anticipated d/c is to: Home              Patient currently is not medically stable to d/c.              Difficult to place patient: No  Authorized and performed by: Valente David, MD Total critical care time:     60    minutes. Due to a high probability of clinically significant, life-threatening deterioration, the patient required my highest level of preparedness to intervene emergently and I personally spent this critical care time directly and personally managing the patient.  This critical care time included obtaining a history, examining the patient, pulse oximetry, ordering and review of studies, arranging urgent treatment with development of management plan, evaluation of patient's response to treatment, frequent reassessment, and discussions with other providers. This critical care time was performed to assess and manage the high probability of imminent, life-threatening deterioration that could result in multiorgan failure.  It was exclusive of separately  billable procedures and treating other patients and teaching time.   Hannah Beat M.D on 07/30/2023 at 7:02 AM  [facility]   From 7 PM-7 AM, contact night-coverage [email or web address]  CC: Primary care physician; Rayetta Humphrey, MD

## 2023-07-30 NOTE — Hospital Course (Addendum)
Taken from H&P.  Larry [name] is a 72 y.o. Caucasian male with medical history significant for essential hypertension, and COPD who presented to the emergency room with acute onset of worsening dyspnea with associated cough productive of clear sputum over the last couple weeks with no fever or chills. He admits to orthopnea and paroxysmal nocturnal dyspnea as well as dyspnea on exertion. He denied any worsening lower extremity edema.   On presentation he was found to have elevated blood pressure at 175/127, tachycardic at 138, tachypneic and hypoxic requiring BiPAP. ABG with pH of 7.44 and pCO2 of 30 with pO2 of 81 and O2 sat of 97.4% with bicarbonate of 20.  CBC showed mild hypokalemia of 3.4 and blood glucose of 189 with CO2 of 21 and AST 46 total protein 6.2 total bili 1.3 and BUN and creatinine were 16 and 1.44 above previous levels in 2020.  I sensitive troponin I was 286 and later 481 with a BNP of 1281.2.  CBC was within normal.  INR is 1.2 and PT 15.4 with PTT of 25.  Respiratory panel came back negative. EKG with sinus tachycardia and premature supraventricular complexes with RBBB and Q waves in septal leads, T wave inversion inferiorly and laterally. CXR with patchy airspace opacity may be due to atelectasis or pneumonia with suspected small bilateral pleural effusions.  The patient was given 40 mg of IV Lasix 125 mg of IV Solu-Medrol 2 g of IV magnesium sulfate and DuoNeb. He was later started on IV heparin with increasing troponin I.   10/31: Vital stable, able to wean off from BiPAP, currently on 4 L of oxygen with no baseline oxygen use.  Renal function seems to be at baseline of 1.4-1.5.  Slowly rising troponin currently at 793. Echocardiogram with a EF of 30 to 35%, global hypokinesis and mildly dilated LV cavity.  Patient had normal EF on an echo done last year.  Cardiology is on board and likely will need ischemic workup.  Patient apparently stopped taking all of his medications  at home for concern of long-term side effects.  11/1: Vital stable, some improvement in creatinine to 1.4, slight worsening of leukocytosis but patient is on steroid.  Troponin continued to rise at 2627, new diagnosis of HFrEF likely patient has NSTEMI.  Still significant orthopnea, cardiology would like to diurese more before taking him to Cath Lab.  11/2: Patient with some improved orthopnea, increasing creatinine so decreased Lasix to daily, cardiac cath on Monday.  11/3: Vital stable, creatinine with some improvement to 1.54 today.  Cath tomorrow.  11/4: Vital stable, orthopnea improved.  Cardiac cath which was originally planned for today got canceled for tomorrow.  11/5: Vital remains stable, Lasix was switched with p.o., remained on heparin infusion and going for cardiac catheterization later today.

## 2023-07-30 NOTE — Assessment & Plan Note (Signed)
-   We will continue statin therapy. - Fasting lipids will be obtained.

## 2023-07-30 NOTE — Assessment & Plan Note (Signed)
Creatinine seems to be around baseline of 1.4-1.5. -Monitor renal function while patient is being diuresed -Avoid nephrotoxins

## 2023-07-30 NOTE — ED Notes (Addendum)
Spoke with pharmacy. Med rec needs to be finished on their side before medications that are overdue can be verified.

## 2023-07-30 NOTE — Assessment & Plan Note (Signed)
Echocardiogram today with EF of 30 to 35%, grade 1 diastolic dysfunction and global hypokinesis with mildly dilated LV. Prior echocardiogram last year was normal. -Continue with IV diuresis -Daily weight and BMP -Strict intake and output

## 2023-07-30 NOTE — Assessment & Plan Note (Addendum)
Slowly worsening troponin, EKG changes and echocardiogram with new onset of HFrEF. -Continue with heparin infusion -Cardiology is on board-likely will need ischemic workup -Continue with beta-blocker and statin

## 2023-07-30 NOTE — Progress Notes (Signed)
Progress Note   Patient: Larry [name] QIO:[id number] DOB: Aug 20, 1951 DOA: 07/29/2023     0 DOS: the patient was seen and examined on 07/30/2023   Brief hospital course: Taken from H&P.  Larry [name] is a 72 y.o. Caucasian male with medical history significant for essential hypertension, and COPD who presented to the emergency room with acute onset of worsening dyspnea with associated cough productive of clear sputum over the last couple weeks with no fever or chills. He admits to orthopnea and paroxysmal nocturnal dyspnea as well as dyspnea on exertion. He denied any worsening lower extremity edema.   On presentation he was found to have elevated blood pressure at 175/127, tachycardic at 138, tachypneic and hypoxic requiring BiPAP. ABG with pH of 7.44 and pCO2 of 30 with pO2 of 81 and O2 sat of 97.4% with bicarbonate of 20.  CBC showed mild hypokalemia of 3.4 and blood glucose of 189 with CO2 of 21 and AST 46 total protein 6.2 total bili 1.3 and BUN and creatinine were 16 and 1.44 above previous levels in 2020.  I sensitive troponin I was 286 and later 481 with a BNP of 1281.2.  CBC was within normal.  INR is 1.2 and PT 15.4 with PTT of 25.  Respiratory panel came back negative. EKG with sinus tachycardia and premature supraventricular complexes with RBBB and Q waves in septal leads, T wave inversion inferiorly and laterally. CXR with patchy airspace opacity may be due to atelectasis or pneumonia with suspected small bilateral pleural effusions.  The patient was given 40 mg of IV Lasix 125 mg of IV Solu-Medrol 2 g of IV magnesium sulfate and DuoNeb. He was later started on IV heparin with increasing troponin I.   10/31: Vital stable, able to wean off from BiPAP, currently on 4 L of oxygen with no baseline oxygen use.  Renal function seems to be at baseline of 1.4-1.5.  Slowly rising troponin currently at 793. Echocardiogram with a EF of 30 to 35%, global hypokinesis and mildly dilated  LV cavity.  Patient had normal EF on an echo done last year.  Cardiology is on board and likely will need ischemic workup.  Patient apparently stopped taking all of his medications at home for concern of long-term side effects.  Assessment and Plan: * Acute respiratory failure with hypoxia (HCC) Initially requiring BiPAP, now on 4 L of oxygen with no baseline oxygen use.  Likely due to acute heart failure, with concern of NSTEMI.  There was also some concern of COPD exacerbation on admission. -Continue supplemental oxygen -Wean as tolerated  Non-STEMI (non-ST elevated myocardial infarction) (HCC) Slowly worsening troponin, EKG changes and echocardiogram with new onset of HFrEF. -Continue with heparin infusion -Cardiology is on board-likely will need ischemic workup -Continue with beta-blocker and statin   Acute HFrEF (heart failure with reduced ejection fraction) (HCC) Echocardiogram today with EF of 30 to 35%, grade 1 diastolic dysfunction and global hypokinesis with mildly dilated LV. Prior echocardiogram last year was normal. -Continue with IV diuresis -Daily weight and BMP -Strict intake and output  Chronic kidney disease, stage 3a (HCC) Creatinine seems to be around baseline of 1.4-1.5. -Monitor renal function while patient is being diuresed -Avoid nephrotoxins  Peripheral neuropathy - We will need Neurontin.  Dyslipidemia - We will continue statin therapy. - Fasting lipids will be obtained.   Subjective: Patient was seen and examined today.  Denies any chest pain or shortness of breath while lying down.  Has  stopped taking his home medications for some time stating that he was reading about the long-term side effects.  Physical Exam: Vitals:   07/30/23 1030 07/30/23 1205 07/30/23 1330 07/30/23 1413  BP: (!) 144/85 (!) 154/103 (!) 129/102   Pulse: (!) 110 (!) 109 (!) 108   Resp: (!) 25 19 20    Temp:    98.4 F (36.9 C)  TempSrc:    Oral  SpO2: 92% 91% 93%    Weight:      Height:       General.  Obese gentleman, in no acute distress. Pulmonary.  Lungs clear bilaterally, normal respiratory effort. CV.  Regular rate and rhythm, no JVD, rub or murmur. Abdomen.  Soft, nontender, nondistended, BS positive. CNS.  Alert and oriented .  No focal neurologic deficit. Extremities.  No edema, no cyanosis, pulses intact and symmetrical. Psychiatry.  Judgment and insight appears normal.   Data Reviewed: Prior data reviewed  Family Communication: Discussed with sister at bedside  Disposition: Status is: Inpatient Remains inpatient appropriate because: Severity of illness  Planned Discharge Destination: Home  DVT prophylaxis.  Heparin infusion Time spent:  minutes  This record has been created using Conservation officer, historic buildings. Errors have been sought and corrected,but may not always be located. Such creation errors do not reflect on the standard of care.   Author: Arnetha Courser, MD 07/30/2023 2:19 PM  For on call review [email or web address].

## 2023-07-30 NOTE — Assessment & Plan Note (Signed)
-   We will need Neurontin.

## 2023-07-30 NOTE — ED Notes (Addendum)
Message sent to pharmacy at this time to verify all meds.

## 2023-07-30 NOTE — Assessment & Plan Note (Addendum)
Initially requiring BiPAP, now on 4 L of oxygen with no baseline oxygen use.  Likely due to acute heart failure, with concern of NSTEMI.  There was also some concern of COPD exacerbation on admission. -Continue supplemental oxygen -Wean as tolerated

## 2023-07-30 NOTE — ED Notes (Addendum)
Called into pt rm due to pt c/o not being able to breath. Pt on 4L . Pt had gotten up to void and became SOB and Tachycardic. Pt encouraged to take breaths.

## 2023-07-30 NOTE — Progress Notes (Signed)
ANTICOAGULATION CONSULT NOTE  Pharmacy Consult for heparin infusion Indication: ACS/STEMI  No Known Allergies  Patient Measurements: Height: 4\' 11"  (149.9 cm) Weight: 79.8 kg (176 lb) IBW/kg (Calculated) : 47.7 Heparin Dosing Weight: 65.7 kg  Vital Signs: Temp: 98.4 F (36.9 C) (10/30 2353) Temp Source: Oral (10/30 2353) BP: 175/127 (10/30 2353) Pulse Rate: 138 (10/30 2353)  Labs: Recent Labs    07/30/23 0040  HGB 15.8  HCT 46.3  PLT 177  CREATININE 1.44*  TROPONINIHS 286*    Estimated Creatinine Clearance: 39.7 mL/min (A) (by C-G formula based on SCr of 1.44 mg/dL (H)).   Medical History: Past Medical History:  Diagnosis Date   Hypertension     Assessment: Pt is a 72 yo male presenting to ED c/o SOB & cough x 1 week, found with elevated BNP & Troponin I level.  Goal of Therapy:  Heparin level 0.3-0.7 units/ml Monitor platelets by anticoagulation protocol: Yes   Plan:  Bolus 3900 units x 1 Start heparin infusion at 850 units/hr Will check HL in 8 hr after start of infusion CBC daily while on heparin  Otelia Sergeant, PharmD, [facility] 07/30/2023 1:54 AM

## 2023-07-30 NOTE — Progress Notes (Signed)
ANTICOAGULATION CONSULT NOTE  Pharmacy Consult for heparin infusion Indication: ACS/STEMI  No Known Allergies  Patient Measurements: Height: 4\' 11"  (149.9 cm) Weight: 79.8 kg (176 lb) IBW/kg (Calculated) : 47.7 Heparin Dosing Weight: 65.7 kg  Vital Signs: Temp: 97.6 F (36.4 C) (10/31 2218) Temp Source: Oral (10/31 2218) BP: 121/88 (10/31 2200) Pulse Rate: 99 (10/31 2200)  Labs: Recent Labs    07/30/23 0040 07/30/23 0201 07/30/23 0426 07/30/23 1054 07/30/23 1705 07/30/23 2208  HGB 15.8  --  16.4  --   --   --   HCT 46.3  --  48.2  --   --   --   PLT 177  --  191  --   --   --   APTT  --  25  --   --   --   --   LABPROT  --  15.4*  --   --   --   --   INR  --  1.2  --   --   --   --   HEPARINUNFRC  --   --   --  0.25*  --  0.37  CREATININE 1.44*  --  1.53*  --   --   --   TROPONINIHS 286* 481* 793*  --  2,148*  --     Estimated Creatinine Clearance: 37.3 mL/min (A) (by C-G formula based on SCr of 1.53 mg/dL (H)).   Medical History: Past Medical History:  Diagnosis Date   Hypertension     Assessment: Pt is a 72 yo male w/ PMH of HTN, COPD presenting to ED c/o SOB & cough x 1 week, found with elevated BNP & Troponin I level.  Goal of Therapy:  Heparin level 0.3-0.7 units/ml Monitor platelets by anticoagulation protocol: Yes   1031 2208 HL 0.37, therapeutic x 1  Plan: heparin level therapeutic  ---continue heparin infusion rate at 1000 units/hr ---Will check heparin level in 8 hrs to confirm ---CBC once daily while on heparin  Barrie Folk, PharmD 07/30/2023 10:37 PM

## 2023-07-30 NOTE — ED Notes (Signed)
Pt asleep. Will re-attempt a temp.

## 2023-07-31 ENCOUNTER — Other Ambulatory Visit (HOSPITAL_COMMUNITY): Payer: Self-pay

## 2023-07-31 ENCOUNTER — Other Ambulatory Visit: Payer: Self-pay

## 2023-07-31 ENCOUNTER — Encounter: Payer: Self-pay | Admitting: Family Medicine

## 2023-07-31 DIAGNOSIS — N1831 Chronic kidney disease, stage 3a: Secondary | ICD-10-CM

## 2023-07-31 DIAGNOSIS — I5021 Acute systolic (congestive) heart failure: Secondary | ICD-10-CM | POA: Diagnosis not present

## 2023-07-31 DIAGNOSIS — G6289 Other specified polyneuropathies: Secondary | ICD-10-CM

## 2023-07-31 DIAGNOSIS — E785 Hyperlipidemia, unspecified: Secondary | ICD-10-CM

## 2023-07-31 DIAGNOSIS — I214 Non-ST elevation (NSTEMI) myocardial infarction: Secondary | ICD-10-CM | POA: Diagnosis not present

## 2023-07-31 DIAGNOSIS — J9601 Acute respiratory failure with hypoxia: Secondary | ICD-10-CM | POA: Diagnosis not present

## 2023-07-31 DIAGNOSIS — J441 Chronic obstructive pulmonary disease with (acute) exacerbation: Secondary | ICD-10-CM | POA: Diagnosis not present

## 2023-07-31 LAB — CBC
HCT: 43.9 % (ref 39.0–52.0)
Hemoglobin: 14.8 g/dL (ref 13.0–17.0)
MCH: 29.8 pg (ref 26.0–34.0)
MCHC: 33.7 g/dL (ref 30.0–36.0)
MCV: 88.5 fL (ref 80.0–100.0)
Platelets: 199 10*3/uL (ref 150–400)
RBC: 4.96 MIL/uL (ref 4.22–5.81)
RDW: 14.4 % (ref 11.5–15.5)
WBC: 15.6 10*3/uL — ABNORMAL HIGH (ref 4.0–10.5)
nRBC: 0 % (ref 0.0–0.2)

## 2023-07-31 LAB — BASIC METABOLIC PANEL
Anion gap: 12 (ref 5–15)
BUN: 27 mg/dL — ABNORMAL HIGH (ref 8–23)
CO2: 23 mmol/L (ref 22–32)
Calcium: 8.7 mg/dL — ABNORMAL LOW (ref 8.9–10.3)
Chloride: 101 mmol/L (ref 98–111)
Creatinine, Ser: 1.4 mg/dL — ABNORMAL HIGH (ref 0.61–1.24)
GFR, Estimated: 53 mL/min — ABNORMAL LOW (ref >60)
Glucose, Bld: 232 mg/dL — ABNORMAL HIGH (ref 70–99)
Potassium: 3.8 mmol/L (ref 3.5–5.1)
Sodium: 136 mmol/L (ref 135–145)

## 2023-07-31 LAB — HEPARIN LEVEL (UNFRACTIONATED): Heparin Unfractionated: 0.43 [IU]/mL (ref 0.30–0.70)

## 2023-07-31 LAB — TROPONIN I (HIGH SENSITIVITY): Troponin I (High Sensitivity): 2627 ng/L (ref <18)

## 2023-07-31 MED ORDER — ASPIRIN 81 MG PO CHEW
81.0000 mg | CHEWABLE_TABLET | Freq: Every day | ORAL | Status: DC
  Administered 2023-07-31 – 2023-08-06 (×6): 81 mg via ORAL
  Filled 2023-07-31 (×7): qty 1

## 2023-07-31 MED ORDER — PREDNISONE 50 MG PO TABS
50.0000 mg | ORAL_TABLET | Freq: Every day | ORAL | Status: AC
  Administered 2023-08-01 – 2023-08-02 (×2): 50 mg via ORAL
  Filled 2023-07-31 (×2): qty 1

## 2023-07-31 MED ORDER — IPRATROPIUM-ALBUTEROL 0.5-2.5 (3) MG/3ML IN SOLN: 3.0000 mL | Freq: Four times a day (QID) | RESPIRATORY_TRACT | Status: DC | PRN

## 2023-07-31 NOTE — Progress Notes (Signed)
Progress Note   Patient: Larry [name] WUJ:[id number] DOB: 1951-03-11 DOA: 07/29/2023     1 DOS: the patient was seen and examined on 07/31/2023   Brief hospital course: Taken from H&P.  [name] is a 72 y.o. Caucasian male with medical history significant for essential hypertension, and COPD who presented to the emergency room with acute onset of worsening dyspnea with associated cough productive of clear sputum over the last couple weeks with no fever or chills. He admits to orthopnea and paroxysmal nocturnal dyspnea as well as dyspnea on exertion. He denied any worsening lower extremity edema.   On presentation he was found to have elevated blood pressure at 175/127, tachycardic at 138, tachypneic and hypoxic requiring BiPAP. ABG with pH of 7.44 and pCO2 of 30 with pO2 of 81 and O2 sat of 97.4% with bicarbonate of 20.  CBC showed mild hypokalemia of 3.4 and blood glucose of 189 with CO2 of 21 and AST 46 total protein 6.2 total bili 1.3 and BUN and creatinine were 16 and 1.44 above previous levels in 2020.  I sensitive troponin I was 286 and later 481 with a BNP of 1281.2.  CBC was within normal.  INR is 1.2 and PT 15.4 with PTT of 25.  Respiratory panel came back negative. EKG with sinus tachycardia and premature supraventricular complexes with RBBB and Q waves in septal leads, T wave inversion inferiorly and laterally. CXR with patchy airspace opacity may be due to atelectasis or pneumonia with suspected small bilateral pleural effusions.  The patient was given 40 mg of IV Lasix 125 mg of IV Solu-Medrol 2 g of IV magnesium sulfate and DuoNeb. He was later started on IV heparin with increasing troponin I.   10/31: Vital stable, able to wean off from BiPAP, currently on 4 L of oxygen with no baseline oxygen use.  Renal function seems to be at baseline of 1.4-1.5.  Slowly rising troponin currently at 793. Echocardiogram with a EF of 30 to 35%, global hypokinesis and mildly dilated LV  cavity.  Patient had normal EF on an echo done last year.  Cardiology is on board and likely will need ischemic workup.  Patient apparently stopped taking all of his medications at home for concern of long-term side effects.  11/1: Vital stable, some improvement in creatinine to 1.4, slight worsening of leukocytosis but patient is on steroid.  Troponin continued to rise at 2627, new diagnosis of HFrEF likely patient has NSTEMI.  Still significant orthopnea, cardiology would like to diurese more before taking him to Cath Lab.  Assessment and Plan: * Acute respiratory failure with hypoxia (HCC) Initially requiring BiPAP, now on 2 L of oxygen with no baseline oxygen use.  Likely due to acute heart failure, with concern of NSTEMI.  There was also some concern of COPD exacerbation on admission. -Continue supplemental oxygen -Wean as tolerated  Non-STEMI (non-ST elevated myocardial infarction) (HCC) Slowly worsening troponin, EKG changes and echocardiogram with new onset of HFrEF. -Continue with heparin infusion -Cardiology is on board-likely will need ischemic workup after optimization of volume -Continue with beta-blocker and statin   Acute HFrEF (heart failure with reduced ejection fraction) (HCC) Echocardiogram today with EF of 30 to 35%, grade 1 diastolic dysfunction and global hypokinesis with mildly dilated LV. Prior echocardiogram last year was normal. -Continue with IV diuresis -Daily weight and BMP -Strict intake and output  COPD exacerbation (HCC) No wheezing today.  Improving oxygen requirement. -Converting Solu-Medrol with prednisone -Continue  with supportive care  Chronic kidney disease, stage 3a (HCC) Creatinine seems to be around baseline of 1.4-1.5.  And currently stable -Monitor renal function while patient is being diuresed -Avoid nephrotoxins  Peripheral neuropathy - We will need Neurontin.  Dyslipidemia - We will continue statin therapy. - Fasting lipids will  be obtained.   Subjective: Patient continued to have significant orthopnea, no chest pain.  Physical Exam: Vitals:   07/31/23 1117 07/31/23 1118 07/31/23 1154 07/31/23 1202  BP:  (!) 132/107 (!) 154/88   Pulse:  86 95   Resp:      Temp: 97.7 F (36.5 C)   (!) 97.4 F (36.3 C)  TempSrc: Oral     SpO2:   100%   Weight:      Height:       General.  Obese gentleman, in no acute distress. Pulmonary.  Lungs clear bilaterally, normal respiratory effort. CV.  Regular rate and rhythm, no JVD, rub or murmur. Abdomen.  Soft, nontender, nondistended, BS positive. CNS.  Alert and oriented .  No focal neurologic deficit. Extremities.  No edema, no cyanosis, pulses intact and symmetrical. Psychiatry.  Judgment and insight appears normal.   Data Reviewed: Prior data reviewed  Family Communication: Discussed with patient  Disposition: Status is: Inpatient Remains inpatient appropriate because: Severity of illness  Planned Discharge Destination: Home  DVT prophylaxis.  Heparin infusion Time spent: 50 minutes  This record has been created using Conservation officer, historic buildings. Errors have been sought and corrected,but may not always be located. Such creation errors do not reflect on the standard of care.   Author: Arnetha Courser, MD 07/31/2023 1:49 PM  For on call review [email or web address].

## 2023-07-31 NOTE — Assessment & Plan Note (Signed)
Creatinine seems to be around baseline of 1.4-1.5.  And currently stable -Monitor renal function while patient is being diuresed -Avoid nephrotoxins

## 2023-07-31 NOTE — Progress Notes (Signed)
[facility] Cardiology  CARDIOLOGY PROGRESS NOTE  Patient ID: Larry [name] MRN: [id number] DOB/AGE: January 16, 1951 72 y.o.  Admit date: 07/29/2023 Referring Physician Valente David, MD Primary Physician Rayetta Humphrey, MD Primary Cardiologist Dorothyann Peng, MD Reason for Consultation NSTEMI  HPI: 72 year old male with past medical history of CAD with very high CAC score, hypertension, hyperlipidemia, diabetes, stroke who presented with worsening exertional dyspnea in last 2 weeks.  Today his breathing has somewhat improved.  Still has orthopnea.  No chest pain or pressure.  States that he did not had a good sleep last night due to ED bed  Review of systems complete and found to be negative unless listed above     Past Medical History:  Diagnosis Date   Hypertension     History reviewed. No pertinent surgical history.  (Not in a hospital admission)  Social History   Socioeconomic History   Marital status: Married    Spouse name: Not on file   Number of children: Not on file   Years of education: Not on file   Highest education level: Not on file  Occupational History   Not on file  Tobacco Use   Smoking status: Never   Smokeless tobacco: Current  Substance and Sexual Activity   Alcohol use: Yes   Drug use: Never   Sexual activity: Not on file  Other Topics Concern   Not on file  Social History Narrative   Not on file   Social Determinants of Health   Financial Resource Strain: Medium Risk (01/20/2023)   Received from [facility]   Overall Financial Resource Strain (CARDIA)    Difficulty of Paying Living Expenses: Somewhat hard  Food Insecurity: Food Insecurity Present (01/20/2023)   Received from [facility]   Hunger Vital Sign    Worried About Running Out of Food in the Last Year: Often true    Ran Out of Food in the Last Year: Often true  Transportation Needs: No Transportation Needs (01/20/2023)   Received from [facility]   PRAPARE - Transportation    In the past 12 months, has lack of transportation kept you from medical appointments or from getting medications?: No    Lack of Transportation (Non-Medical): No  Physical Activity: Not on file  Stress: Not on file  Social Connections: Not on file  Intimate Partner Violence: Not on file    History reviewed. No pertinent family history.    Review of systems complete and found to be negative unless listed above      PHYSICAL EXAM  General: Well developed, well nourished, in no acute distress HEENT:  Normocephalic and atramatic Neck: +1 pedal edema.  JVD elevated.   Lungs: Basilar crackles heard Heart: HRRR . Normal S1 and S2 without gallops or murmurs.  Warm extremities  Labs:   Lab Results  Component Value Date   WBC 15.6 (H) 07/31/2023   HGB 14.8 07/31/2023   HCT 43.9 07/31/2023   MCV 88.5 07/31/2023   PLT 199 07/31/2023    Recent Labs  Lab 07/30/23 0040 07/30/23 0426 07/31/23 0511  NA 138   < > 136  K 3.4*   < > 3.8  CL 104   < > 101  CO2 21*   < > 23  BUN 16   < > 27*  CREATININE 1.44*   < > 1.40*  CALCIUM 8.5*   < > 8.7*  PROT 6.2*  --   --  BILITOT 1.3*  --   --   ALKPHOS 77  --   --   ALT 38  --   --   AST 46*  --   --   GLUCOSE 189*   < > 232*   < > = values in this interval not displayed.   Lab Results  Component Value Date   TROPONINI < 0.02 05/19/2014   No results found for: "CHOL" No results found for: "HDL" No results found for: "LDLCALC" No results found for: "TRIG" No results found for: "CHOLHDL" No results found for: "LDLDIRECT"    Radiology: ECHOCARDIOGRAM COMPLETE  Result Date: 07/30/2023    ECHOCARDIOGRAM REPORT   Patient Name:   Larry [name] Date of Exam: 07/30/2023 Medical Rec #:  [id number]          Height:       59.0 in Accession #:    [id number]         Weight:       176.0 lb Date of Birth:  05/01/1951          BSA:          1.747 m Patient Age:    72 years           BP:            124/102 mmHg Patient Gender: M                  HR:           118 bpm. Exam Location:  [facility] Procedure: 2D Echo, Cardiac Doppler, Color Doppler and Intracardiac            Opacification Agent Indications:     CHF  History:         Patient has no prior history of Echocardiogram examinations.                  CHF, Acute MI, COPD; Risk Factors:Dyslipidemia.  Sonographer:     Mikki Harbor Referring Phys:  [id number] JAN A MANSY Diagnosing Phys: Julien Nordmann MD  Sonographer Comments: Image acquisition challenging due to COPD. IMPRESSIONS  1. Left ventricular ejection fraction, by estimation, is 30 to 35%. Left ventricular ejection fraction by PLAX is 32 %. The left ventricle has moderately decreased function. The left ventricle demonstrates global hypokinesis. The left ventricular internal cavity size was mildly dilated. Left ventricular diastolic parameters are indeterminate.  2. Right ventricular systolic function is normal. The right ventricular size is normal. Tricuspid regurgitation signal is inadequate for assessing PA pressure. The estimated right ventricular systolic pressure is 17.4 mmHg.  3. The mitral valve is normal in structure. Mild to moderate mitral valve regurgitation. No evidence of mitral stenosis.  4. The aortic valve is normal in structure. Aortic valve regurgitation is mild. No aortic stenosis is present.  5. The inferior vena cava is normal in size with greater than 50% respiratory variability, suggesting right atrial pressure of 3 mmHg. FINDINGS  Left Ventricle: Left ventricular ejection fraction, by estimation, is 30 to 35%. Left ventricular ejection fraction by PLAX is 32 %. The left ventricle has moderately decreased function. The left ventricle demonstrates global hypokinesis. Definity contrast agent was given IV to delineate the left ventricular endocardial borders. The left ventricular internal cavity size was mildly dilated. There is no left ventricular hypertrophy. Left ventricular  diastolic parameters are indeterminate. Right Ventricle: The right ventricular size is normal. No increase in right ventricular wall thickness. Right ventricular systolic  function is normal. Tricuspid regurgitation signal is inadequate for assessing PA pressure. The tricuspid regurgitant velocity is 1.90 m/s, and with an assumed right atrial pressure of 3 mmHg, the estimated right ventricular systolic pressure is 17.4 mmHg. Left Atrium: Left atrial size was normal in size. Right Atrium: Right atrial size was normal in size. Pericardium: There is no evidence of pericardial effusion. Mitral Valve: The mitral valve is normal in structure. Mild to moderate mitral valve regurgitation. No evidence of mitral valve stenosis. MV peak gradient, 5.5 mmHg. The mean mitral valve gradient is 2.0 mmHg. Tricuspid Valve: The tricuspid valve is normal in structure. Tricuspid valve regurgitation is mild . No evidence of tricuspid stenosis. Aortic Valve: The aortic valve is normal in structure. Aortic valve regurgitation is mild. No aortic stenosis is present. Aortic valve mean gradient measures 3.0 mmHg. Aortic valve peak gradient measures 4.8 mmHg. Aortic valve area, by VTI measures 2.39 cm. Pulmonic Valve: The pulmonic valve was normal in structure. Pulmonic valve regurgitation is not visualized. No evidence of pulmonic stenosis. Aorta: The aortic root is normal in size and structure. Venous: The inferior vena cava is normal in size with greater than 50% respiratory variability, suggesting right atrial pressure of 3 mmHg. IAS/Shunts: No atrial level shunt detected by color flow Doppler.  LEFT VENTRICLE PLAX 2D LV EF:         Left            Diastology                ventricular     LV e' medial:    10.80 cm/s                ejection        LV E/e' medial:  9.1                fraction by     LV e' lateral:   9.14 cm/s                PLAX is 32      LV E/e' lateral: 10.8                %. LVIDd:         4.70 cm LVIDs:         4.00 cm  LV PW:         1.30 cm LV IVS:        1.20 cm LVOT diam:     2.00 cm LV SV:         43 LV SV Index:   25 LVOT Area:     3.14 cm  LV Volumes (MOD) LV vol d, MOD    96.6 ml A4C: LV vol s, MOD    63.9 ml A4C: LV SV MOD A4C:   96.6 ml RIGHT VENTRICLE RV Basal diam:  3.20 cm RV Mid diam:    2.80 cm RV S prime:     12.60 cm/s LEFT ATRIUM             Index        RIGHT ATRIUM           Index LA diam:        3.80 cm 2.18 cm/m   RA Area:     12.80 cm LA Vol (A2C):   47.7 ml 27.31 ml/m  RA Volume:   33.40 ml  19.12 ml/m LA Vol (A4C):   53.4 ml 30.57 ml/m LA Biplane  Vol: 51.4 ml 29.42 ml/m  AORTIC VALVE                    PULMONIC VALVE AV Area (Vmax):    2.46 cm     PV Vmax:       0.75 m/s AV Area (Vmean):   2.12 cm     PV Peak grad:  2.3 mmHg AV Area (VTI):     2.39 cm AV Vmax:           109.50 cm/s AV Vmean:          81.750 cm/s AV VTI:            0.180 m AV Peak Grad:      4.8 mmHg AV Mean Grad:      3.0 mmHg LVOT Vmax:         85.67 cm/s LVOT Vmean:        55.067 cm/s LVOT VTI:          0.136 m LVOT/AV VTI ratio: 0.76  AORTA Ao Root diam: 3.30 cm MITRAL VALVE               TRICUSPID VALVE MV Area (PHT): 7.32 cm    TR Peak grad:   14.4 mmHg MV Area VTI:   1.95 cm    TR Vmax:        190.00 cm/s MV Peak grad:  5.5 mmHg MV Mean grad:  2.0 mmHg    SHUNTS MV Vmax:       1.17 m/s    Systemic VTI:  0.14 m MV Vmean:      65.6 cm/s   Systemic Diam: 2.00 cm MV Decel Time: 104 msec MV E velocity: 98.38 cm/s MV A velocity: 84.35 cm/s MV E/A ratio:  1.17 Julien Nordmann MD Electronically signed by Julien Nordmann MD Signature Date/Time: 07/30/2023/1:36:14 PM    Final    DG Chest 1 View  Result Date: 07/30/2023 CLINICAL DATA:  Shortness of breath EXAM: CHEST  1 VIEW COMPARISON:  05/19/2014 FINDINGS: Low lung volumes accentuate pulmonary vascularity. Normal cardiomediastinal silhouette. Patchy bilateral airspace opacities. Retrocardiac atelectasis or consolidation. Suspected small bilateral pleural effusions. No  pneumothorax. No displaced rib fractures. IMPRESSION: Patchy airspace opacities may be due to atelectasis or pneumonia. Suspected small bilateral pleural effusions. Electronically Signed   By: Minerva Fester M.D.   On: 07/30/2023 02:30     ASSESSMENT AND PLAN:  Acute heart failure with reduced EF, LVEF 30 to 35% NSTEMI, type II versus type I CAD CKD 3a  Diuresing well.  Continue current IV diuresis. Continue heparin drip, aspirin Continue metoprolol and statin Plan for right/left heart catheterization on Monday.  Keep n.p.o. after midnight on Sunday  Signed: Kathryne Gin MD,PhD, [facility] 07/31/2023, 7:10 AM

## 2023-07-31 NOTE — Assessment & Plan Note (Signed)
No wheezing today.  Improving oxygen requirement. -Converting Solu-Medrol with prednisone -Continue with supportive care

## 2023-07-31 NOTE — ED Notes (Signed)
Pt up to the bathroom at this time. Gait steady.

## 2023-07-31 NOTE — Assessment & Plan Note (Signed)
Slowly worsening troponin, EKG changes and echocardiogram with new onset of HFrEF. -Continue with heparin infusion -Cardiology is on board-likely will need ischemic workup after optimization of volume -Continue with beta-blocker and statin

## 2023-07-31 NOTE — Assessment & Plan Note (Signed)
Initially requiring BiPAP, now on 2 L of oxygen with no baseline oxygen use.  Likely due to acute heart failure, with concern of NSTEMI.  There was also some concern of COPD exacerbation on admission. -Continue supplemental oxygen -Wean as tolerated

## 2023-07-31 NOTE — Assessment & Plan Note (Signed)
Echocardiogram today with EF of 30 to 35%, grade 1 diastolic dysfunction and global hypokinesis with mildly dilated LV. Prior echocardiogram last year was normal. -Continue with IV diuresis-dose decreased to daily due to slight worsening of creatinine -Daily weight and BMP -Strict intake and output

## 2023-07-31 NOTE — Progress Notes (Signed)
Heart Failure Stewardship Pharmacy Note  PCP: Rayetta Humphrey, MD PCP-Cardiologist: None  HPI: Larry [name] is a 72 y.o. male with hypertension, dyslipidemia, CAD, CVA and COPD who presented with worsening of exertional dyspnea over the past 2 weeks. Arrived to the emergency department with tachycardia, hypertension, and hypoxia requiring BIPAP.  Troponin 2148 on admission, now 2627. BNP on admission significantly elevated to 1281.2. TTE this admission showed LVEF of 30-35%, mild-moderate MR.  Pertinent cardiac history: Stress test and TTE in 05/2014 with normal LVEF and no evidence of ischemia. Stress test 07/2022 showed perfusion scar area of anterior apical inferior defect consistent with ischemia and LVEF was 59%. TTE 07/2022 showed normal LVEF.   Pertinent Lab Values: Creatinine  Date Value Ref Range Status  05/19/2014 1.78 (H) 0.60 - 1.30 mg/dL Final   Creatinine, Ser  Date Value Ref Range Status  07/31/2023 1.40 (H) 0.61 - 1.24 mg/dL Final   BUN  Date Value Ref Range Status  07/31/2023 27 (H) 8 - 23 mg/dL Final  16/06/9603 19 (H) 7 - 18 mg/dL Final   Potassium  Date Value Ref Range Status  07/31/2023 3.8 3.5 - 5.1 mmol/L Final  05/19/2014 3.4 (L) 3.5 - 5.1 mmol/L Final   Sodium  Date Value Ref Range Status  07/31/2023 136 135 - 145 mmol/L Final  05/19/2014 141 136 - 145 mmol/L Final   B Natriuretic Peptide  Date Value Ref Range Status  07/30/2023 1,281.2 (H) 0.0 - 100.0 pg/mL Final    Comment:    Performed at [facility], [address] [place], [place] [address]   Magnesium  Date Value Ref Range Status  07/30/2023 2.0 1.7 - 2.4 mg/dL Final    Comment:    Performed at [facility], [address] [place], [place] [address]   Hgb A1c MFr Bld  Date Value Ref Range Status  07/30/2023 6.2 (H) 4.8 - 5.6 % Final    Comment:    (NOTE) Pre diabetes:          5.7%-6.4%  Diabetes:              >6.4%  Glycemic control for    <7.0% adults with diabetes     Vital Signs: Admission weight: 176 lbs Temp:  [97.6 F (36.4 C)-98.4 F (36.9 C)] 97.9 F (36.6 C) (11/01 0515) Pulse Rate:  [72-112] 73 (11/01 0730) Cardiac Rhythm: Normal sinus rhythm;Sinus tachycardia (10/31 2220) Resp:  [15-25] 16 (11/01 0730) BP: (113-154)/(85-131) 116/92 (11/01 0730) SpO2:  [91 %-98 %] 98 % (11/01 0730)  Intake/Output Summary (Last 24 hours) at 07/31/2023 0916 Last data filed at 07/31/2023 0000 Gross per 24 hour  Intake --  Output 2000 ml  Net -2000 ml    Current Heart Failure Medications:  Loop diuretic: furosemide 40 mg IV q12h Beta-Blocker: metoprolol tartrate 100 mg BID ACEI/ARB/ARNI: lisinopril 10 mg daily MRA: none SGLT2i: none   Prior to admission Heart Failure Medications:  Loop diuretic: none Beta-Blocker: metoprolol tartrate 100 mg BID  ACEI/ARB/ARNI: lisinopril 10 mg daily MRA: none SGLT2i: none Other vasoactive medications include amlodipine 10 mg daily  Assessment: 1. Acute systolic heart failure (LVEF 30-35%)  , due to suspected ICM based on prev stress test with evidence of ischemia . NYHA class IIIB symptoms.  -Symptoms: Reports shortness of breath moving from the bed to the toilet. Reports persistent fatigue, orthopnea, and cold hands and feet. Symptoms are concerning for low output, however CO2 on BMET is normal  and patient is no longer tachycardic.  -Volume: No significant lower extremity edema noted on exam. Given symptoms and BNP on admission, there is likely significant pulmonary edema present. Creatinine is improving with diuresis. Urine is clear. Patient reports good urine output. -Hemodynamics: BP is much improved after adding home medications. Patient is no longer tachycardic, however this may be due to restarting home metoprolol -BB: Home metoprolol 100 mg BID resumed. If patient has low output would recommend reduction by 50% or holding. Would need to be converted to metoprolol succinate  before discharge. -ACEI/ARB/ARNI: Continue lisinopril 10 mg daily. -MRA: spironolactone can be added after cath to help maintain potassium >4, augment diuresis, and provide mortality reduction in HFrEF. -SGLT2i: Can consider adding after cath if creatinine is stable to augment diuresis.  Plan: 1) Medication changes recommended at this time: -Consider checking lactic acid to rule out cardiogenic shock with low output heart failure. May require inotropes if elevated. -Can consider reducing metoprolol to 50 mg BID during diuresis  2) Patient assistance: -Pending  3) Education: - Patient has been educated on current HF medications and potential additions to HF medication regimen - Patient verbalizes understanding that over the next few months, these medication doses may change and more medications may be added to optimize HF regimen - Patient has been educated on basic disease state pathophysiology and goals of therapy  Medication Assistance / Insurance Benefits Check: Does the patient have prescription insurance?    Type of insurance plan:  Does the patient qualify for medication assistance through manufacturers or grants? Pending  Outpatient Pharmacy: Prior to admission outpatient [facility]: [facility] order     Please do not hesitate to reach out with questions or concerns,  Enos Fling, PharmD, CPP, BCPS Heart Failure Pharmacist  Phone - [phone] 07/31/2023 9:16 AM

## 2023-07-31 NOTE — Plan of Care (Signed)
  Problem: Education: Goal: Ability to demonstrate management of disease process will improve Outcome: Progressing Goal: Ability to verbalize understanding of medication therapies will improve Outcome: Progressing Goal: Individualized Educational Video(s) Outcome: Progressing   Problem: Activity: Goal: Capacity to carry out activities will improve Outcome: Progressing   Problem: Cardiac: Goal: Ability to achieve and maintain adequate cardiopulmonary perfusion will improve Outcome: Progressing   Problem: Education: Goal: Knowledge of General Education information will improve Description: Including pain rating scale, medication(s)/side effects and non-pharmacologic comfort measures Outcome: Progressing   Problem: Health Behavior/Discharge Planning: Goal: Ability to manage health-related needs will improve Outcome: Progressing   Problem: Clinical Measurements: Goal: Ability to maintain clinical measurements within normal limits will improve Outcome: Progressing Goal: Will remain free from infection Outcome: Progressing Goal: Diagnostic test results will improve Outcome: Progressing Goal: Respiratory complications will improve Outcome: Progressing Goal: Cardiovascular complication will be avoided Outcome: Progressing   Problem: Activity: Goal: Risk for activity intolerance will decrease Outcome: Progressing   Problem: Nutrition: Goal: Adequate nutrition will be maintained Outcome: Progressing   Problem: Coping: Goal: Level of anxiety will decrease Outcome: Progressing   Problem: Elimination: Goal: Will not experience complications related to bowel motility Outcome: Progressing Goal: Will not experience complications related to urinary retention Outcome: Progressing   Problem: Pain Management: Goal: General experience of comfort will improve Outcome: Progressing   Problem: Safety: Goal: Ability to remain free from injury will improve Outcome: Progressing    Problem: Skin Integrity: Goal: Risk for impaired skin integrity will decrease Outcome: Progressing

## 2023-07-31 NOTE — TOC Benefit Eligibility Note (Signed)
Patient Product/process development scientist completed.    The patient is insured through [employer]. Patient has Medicare and is not eligible for a copay card, but may be able to apply for patient assistance, if available.    Ran test claim for Entresto 24-26 mg and the current 30 day co-pay is $570.51 due to having a $545.00 deductible.  Ran test claim for Farixga 10 mg and the current 30 day co-pay is $545.65 due to having a $545.00 deductible.  Ran test claim for Jardiance 10 mg and the current 30 day co-pay is $552.43 due to having a $545.00 deductible.  This test claim was processed through [facility]- copay amounts may vary at other pharmacies due to pharmacy/plan contracts, or as the patient moves through the different stages of their insurance plan.     Roland Earl, CPHT Pharmacy Technician III Certified Patient Advocate [facility] Pharmacy Patient Advocate Team Direct Number: [phone]  Fax: [phone]

## 2023-07-31 NOTE — Progress Notes (Signed)
Transition of Care [facility]) - Inpatient Brief Assessment   Patient Details  Name: Larry [name] MRN: [id number] Date of Birth: [birth year]  Transition of Care [facility]) CM/SW Contact:    Darolyn Rua, LCSW Phone Number: 07/31/2023, 2:36 PM   Clinical Narrative:  Patient presents to ED from home with SOB and cough, cardiology following for continued medical workup.  PCP: Dr. Greggory Stallion Insurance: [facility] Medicare  Please consult TOC should discharge needs arise.   Transition of Care Asessment: Insurance and Status: Insurance coverage has been reviewed Patient has primary care physician: Yes Home environment has been reviewed: from home Prior level of function:: independent Prior/Current Home Services: No current home services Social Determinants of Health Reivew: SDOH reviewed no interventions necessary Readmission risk has been reviewed: Yes Transition of care needs: no transition of care needs at this time

## 2023-07-31 NOTE — Progress Notes (Signed)
ANTICOAGULATION CONSULT NOTE  Pharmacy Consult for heparin infusion Indication: ACS/STEMI  No Known Allergies  Patient Measurements: Height: 4\' 11"  (149.9 cm) Weight: 79.8 kg (176 lb) IBW/kg (Calculated) : 47.7 Heparin Dosing Weight: 65.7 kg  Vital Signs: Temp: 97.9 F (36.6 C) (11/01 0515) Temp Source: Oral (11/01 0515) BP: 122/99 (11/01 0500) Pulse Rate: 76 (11/01 0500)  Labs: Recent Labs    07/30/23 0040 07/30/23 0201 07/30/23 0426 07/30/23 1054 07/30/23 1705 07/30/23 2208 07/31/23 0511  HGB 15.8  --  16.4  --   --   --  14.8  HCT 46.3  --  48.2  --   --   --  43.9  PLT 177  --  191  --   --   --  199  APTT  --  25  --   --   --   --   --   LABPROT  --  15.4*  --   --   --   --   --   INR  --  1.2  --   --   --   --   --   HEPARINUNFRC  --   --   --  0.25*  --  0.37 0.43  CREATININE 1.44*  --  1.53*  --   --   --  1.40*  TROPONINIHS 286* 481* 793*  --  2,148* 1,943*  --     Estimated Creatinine Clearance: 40.8 mL/min (A) (by C-G formula based on SCr of 1.4 mg/dL (H)).   Medical History: Past Medical History:  Diagnosis Date   Hypertension     Assessment: Pt is a 72 yo male w/ PMH of HTN, COPD presenting to ED c/o SOB & cough x 1 week, found with elevated BNP & Troponin I level.  Goal of Therapy:  Heparin level 0.3-0.7 units/ml Monitor platelets by anticoagulation protocol: Yes   1031 2208 HL 0.37, therapeutic x 1 1101 0511 HL 0.43, therapeutic x 2  Plan: heparin level therapeutic  ---continue heparin infusion rate at 1000 units/hr ---Will check HL daily w/ AM while therapeutic ---CBC once daily while on heparin  Otelia Sergeant, PharmD, [facility] 07/31/2023 6:29 AM

## 2023-08-01 DIAGNOSIS — J9601 Acute respiratory failure with hypoxia: Secondary | ICD-10-CM | POA: Diagnosis not present

## 2023-08-01 DIAGNOSIS — I214 Non-ST elevation (NSTEMI) myocardial infarction: Secondary | ICD-10-CM | POA: Diagnosis not present

## 2023-08-01 DIAGNOSIS — I5021 Acute systolic (congestive) heart failure: Secondary | ICD-10-CM | POA: Diagnosis not present

## 2023-08-01 DIAGNOSIS — J441 Chronic obstructive pulmonary disease with (acute) exacerbation: Secondary | ICD-10-CM | POA: Diagnosis not present

## 2023-08-01 LAB — CBC
HCT: 40.8 % (ref 39.0–52.0)
Hemoglobin: 13.8 g/dL (ref 13.0–17.0)
MCH: 30.4 pg (ref 26.0–34.0)
MCHC: 33.8 g/dL (ref 30.0–36.0)
MCV: 89.9 fL (ref 80.0–100.0)
Platelets: 175 10*3/uL (ref 150–400)
RBC: 4.54 MIL/uL (ref 4.22–5.81)
RDW: 13.9 % (ref 11.5–15.5)
WBC: 14.4 10*3/uL — ABNORMAL HIGH (ref 4.0–10.5)
nRBC: 0 % (ref 0.0–0.2)

## 2023-08-01 LAB — HEPARIN LEVEL (UNFRACTIONATED): Heparin Unfractionated: 0.42 [IU]/mL (ref 0.30–0.70)

## 2023-08-01 LAB — BASIC METABOLIC PANEL
Anion gap: 11 (ref 5–15)
BUN: 45 mg/dL — ABNORMAL HIGH (ref 8–23)
CO2: 23 mmol/L (ref 22–32)
Calcium: 8.4 mg/dL — ABNORMAL LOW (ref 8.9–10.3)
Chloride: 100 mmol/L (ref 98–111)
Creatinine, Ser: 1.64 mg/dL — ABNORMAL HIGH (ref 0.61–1.24)
GFR, Estimated: 44 mL/min — ABNORMAL LOW (ref >60)
Glucose, Bld: 283 mg/dL — ABNORMAL HIGH (ref 70–99)
Potassium: 4 mmol/L (ref 3.5–5.1)
Sodium: 134 mmol/L — ABNORMAL LOW (ref 135–145)

## 2023-08-01 MED ORDER — FUROSEMIDE 10 MG/ML IJ SOLN
40.0000 mg | Freq: Every day | INTRAMUSCULAR | Status: DC
Start: 2023-08-02 — End: 2023-08-04
  Administered 2023-08-02 – 2023-08-03 (×2): 40 mg via INTRAVENOUS
  Filled 2023-08-01 (×3): qty 4

## 2023-08-01 MED ORDER — SODIUM CHLORIDE 0.9% FLUSH
3.0000 mL | Freq: Two times a day (BID) | INTRAVENOUS | Status: DC
Start: 2023-08-01 — End: 2023-08-06
  Administered 2023-08-01 – 2023-08-06 (×9): 3 mL via INTRAVENOUS

## 2023-08-01 NOTE — Progress Notes (Signed)
[facility] Cardiology    SUBJECTIVE: Status post chest pain angina shortness of breath presented with unstable angina ACS and non-STEMI symptoms of improved reduced shortness of breath now no more chest pain still on heparin patient preop for cardiac catheter Monday   Vitals:   07/31/23 2324 08/01/23 0339 08/01/23 0342 08/01/23 0757  BP: 98/62  96/63 130/77  Pulse: 82  76 (!) 52  Resp: 20  20 17   Temp: 98.7 F (37.1 C)  97.6 F (36.4 C) 98.5 F (36.9 C)  TempSrc: Oral  Oral   SpO2: 93%  95% 98%  Weight:  76.3 kg    Height:         Intake/Output Summary (Last 24 hours) at 08/01/2023 1059 Last data filed at 08/01/2023 8295 Gross per 24 hour  Intake 841.71 ml  Output 1100 ml  Net -258.29 ml      PHYSICAL EXAM  General: Well developed, well nourished, in no acute distress HEENT:  Normocephalic and atramatic Neck:  No JVD.  Lungs: Clear bilaterally to auscultation and percussion. Heart: HRRR . Normal S1 and S2 without gallops or murmurs.  Abdomen: Bowel sounds are positive, abdomen soft and non-tender  Msk:  Back normal, normal gait. Normal strength and tone for age. Extremities: No clubbing, cyanosis or edema.   Neuro: Alert and oriented X 3. Psych:  Good affect, responds appropriately   LABS: Basic Metabolic Panel: Recent Labs    07/30/23 0040 07/30/23 0426 07/31/23 0511 08/01/23 0258  NA 138   < > 136 134*  K 3.4*   < > 3.8 4.0  CL 104   < > 101 100  CO2 21*   < > 23 23  GLUCOSE 189*   < > 232* 283*  BUN 16   < > 27* 45*  CREATININE 1.44*   < > 1.40* 1.64*  CALCIUM 8.5*   < > 8.7* 8.4*  MG 2.0  --   --   --    < > = values in this interval not displayed.   Liver Function Tests: Recent Labs    07/30/23 0040  AST 46*  ALT 38  ALKPHOS 77  BILITOT 1.3*  PROT 6.2*  ALBUMIN 3.6   No results for input(s): "LIPASE", "AMYLASE" in the last 72 hours. CBC: Recent Labs    07/30/23 0040 07/30/23 0426 07/31/23 0511 08/01/23 0258  WBC 7.9   < > 15.6* 14.4*   NEUTROABS 6.2  --   --   --   HGB 15.8   < > 14.8 13.8  HCT 46.3   < > 43.9 40.8  MCV 88.5   < > 88.5 89.9  PLT 177   < > 199 175   < > = values in this interval not displayed.   Cardiac Enzymes: No results for input(s): "CKTOTAL", "CKMB", "CKMBINDEX", "TROPONINI" in the last 72 hours. BNP: Invalid input(s): "POCBNP" D-Dimer: No results for input(s): "DDIMER" in the last 72 hours. Hemoglobin A1C: Recent Labs    07/30/23 0040  HGBA1C 6.2*   Fasting Lipid Panel: No results for input(s): "CHOL", "HDL", "LDLCALC", "TRIG", "CHOLHDL", "LDLDIRECT" in the last 72 hours. Thyroid Function Tests: No results for input(s): "TSH", "T4TOTAL", "T3FREE", "THYROIDAB" in the last 72 hours.  Invalid input(s): "FREET3" Anemia Panel: No results for input(s): "VITAMINB12", "FOLATE", "FERRITIN", "TIBC", "IRON", "RETICCTPCT" in the last 72 hours.  No results found.   Echo moderately depressed left ventricular function EF around 30 to 35%  TELEMETRY: Normal sinus rhythm rate  of around 80:  ASSESSMENT AND PLAN:  Principal Problem:   Acute respiratory failure with hypoxia (HCC) Active Problems:   Non-STEMI (non-ST elevated myocardial infarction) (HCC)   Dyslipidemia   Peripheral neuropathy   COPD exacerbation (HCC)   Acute congestive heart failure (HCC)   Acute HFrEF (heart failure with reduced ejection fraction) (HCC)   Chronic kidney disease, stage 3a (HCC) Shortness of breath  Plan Admit to telemetry follow-up EKGs follow-up troponins With heparin therapy for anticoagulation post non-STEMI Chronic renal sufficiency will have the patient follow-up with nephrology maintain adequate hydration Continue hyperlipidemia management Acute on chronic heart failure with depressed left ventricular function continue medical therapy and diuretics COPD continue inhalers as necessary consider follow-up with pulmonary Obesity recommend modest weight loss exercise portion control Non-STEMI peak  troponin 2600 Agree with cardiac cath on Monday for evaluation of non-STEMI coronary artery disease   Alwyn Pea, MD 08/01/2023 10:59 AM

## 2023-08-01 NOTE — Plan of Care (Signed)
  Problem: Cardiac: Goal: Ability to achieve and maintain adequate cardiopulmonary perfusion will improve Outcome: Not Progressing   Problem: Clinical Measurements: Goal: Will remain free from infection Outcome: Not Progressing   Problem: Coping: Goal: Level of anxiety will decrease Outcome: Not Progressing   Problem: Skin Integrity: Goal: Risk for impaired skin integrity will decrease Outcome: Not Progressing

## 2023-08-01 NOTE — Progress Notes (Signed)
Progress Note   Patient: Larry [name] [id number]:[id number] DOB: [birth year] DOA: 07/29/2023     2 DOS: the patient was seen and examined on 08/01/2023   Brief hospital course: Taken from H&P.  Larry [name] is a 72 y.o. Caucasian male with medical history significant for essential hypertension, and COPD who presented to the emergency room with acute onset of worsening dyspnea with associated cough productive of clear sputum over the last couple weeks with no fever or chills. He admits to orthopnea and paroxysmal nocturnal dyspnea as well as dyspnea on exertion. He denied any worsening lower extremity edema.   On presentation he was found to have elevated blood pressure at 175/127, tachycardic at 138, tachypneic and hypoxic requiring BiPAP. ABG with pH of 7.44 and pCO2 of 30 with pO2 of 81 and O2 sat of 97.4% with bicarbonate of 20.  CBC showed mild hypokalemia of 3.4 and blood glucose of 189 with CO2 of 21 and AST 46 total protein 6.2 total bili 1.3 and BUN and creatinine were 16 and 1.44 above previous levels in 2020.  I sensitive troponin I was 286 and later 481 with a BNP of 1281.2.  CBC was within normal.  INR is 1.2 and PT 15.4 with PTT of 25.  Respiratory panel came back negative. EKG with sinus tachycardia and premature supraventricular complexes with RBBB and Q waves in septal leads, T wave inversion inferiorly and laterally. CXR with patchy airspace opacity may be due to atelectasis or pneumonia with suspected small bilateral pleural effusions.  The patient was given 40 mg of IV Lasix 125 mg of IV Solu-Medrol 2 g of IV magnesium sulfate and DuoNeb. He was later started on IV heparin with increasing troponin I.   10/31: Vital stable, able to wean off from BiPAP, currently on 4 L of oxygen with no baseline oxygen use.  Renal function seems to be at baseline of 1.4-1.5.  Slowly rising troponin currently at 793. Echocardiogram with a EF of 30 to 35%, global hypokinesis and mildly dilated LV  cavity.  Patient had normal EF on an echo done last year.  Cardiology is on board and likely will need ischemic workup.  Patient apparently stopped taking all of his medications at home for concern of long-term side effects.  11/1: Vital stable, some improvement in creatinine to 1.4, slight worsening of leukocytosis but patient is on steroid.  Troponin continued to rise at 2627, new diagnosis of HFrEF likely patient has NSTEMI.  Still significant orthopnea, cardiology would like to diurese more before taking him to Cath Lab.  Assessment and Plan: * Acute respiratory failure with hypoxia (HCC) Initially requiring BiPAP, now on 2 L of oxygen with no baseline oxygen use.  Likely due to acute heart failure, with concern of NSTEMI.  There was also some concern of COPD exacerbation on admission. -Continue supplemental oxygen -Wean as tolerated  Non-STEMI (non-ST elevated myocardial infarction) (HCC) Slowly worsening troponin, EKG changes and echocardiogram with new onset of HFrEF. -Continue with heparin infusion -Cardiology is on board-cardiac cath on Monday -Continue with beta-blocker and statin   Acute HFrEF (heart failure with reduced ejection fraction) (HCC) Echocardiogram today with EF of 30 to 35%, grade 1 diastolic dysfunction and global hypokinesis with mildly dilated LV. Prior echocardiogram last year was normal. -Continue with IV diuresis-dose decreased to daily due to slight worsening of creatinine -Daily weight and BMP -Strict intake and output  COPD exacerbation (HCC) No wheezing today.  Improving oxygen requirement. -Converting  Solu-Medrol with prednisone -Continue with supportive care  Chronic kidney disease, stage 3a (HCC) Creatinine seems to be around baseline of 1.4-1.5.  And currently stable -Monitor renal function while patient is being diuresed -Avoid nephrotoxins  Peripheral neuropathy - We will need Neurontin.  Dyslipidemia - We will continue statin  therapy. - Fasting lipids will be obtained.   Subjective: Patient was sitting in bed when seen today.  Stating that his shortness of breath is improving and he was able to lay down little down but not completely flat yet  Physical Exam: Vitals:   08/01/23 0342 08/01/23 0757 08/01/23 1104 08/01/23 1216  BP: 96/63 130/77  (!) 150/92  Pulse: 76 (!) 52  85  Resp: 20 17  16   Temp: 97.6 F (36.4 C) 98.5 F (36.9 C)  97.7 F (36.5 C)  TempSrc: Oral     SpO2: 95% 98% 94% 94%  Weight:      Height:       General.  Frail elderly man, in no acute distress. Pulmonary.  Lungs clear bilaterally, normal respiratory effort. CV.  Regular rate and rhythm, no JVD, rub or murmur. Abdomen.  Soft, nontender, nondistended, BS positive. CNS.  Alert and oriented .  No focal neurologic deficit. Extremities.  No edema, no cyanosis, pulses intact and symmetrical. Psychiatry.  Judgment and insight appears normal.   Data Reviewed: Prior data reviewed  Family Communication: Discussed with sister at bedside  Disposition: Status is: Inpatient Remains inpatient appropriate because: Severity of illness  Planned Discharge Destination: Home  DVT prophylaxis.  Heparin infusion Time spent: 50 minutes  This record has been created using Conservation officer, historic buildings. Errors have been sought and corrected,but may not always be located. Such creation errors do not reflect on the standard of care.   Author: Arnetha Courser, MD 08/01/2023 4:50 PM  For on call review [email or web address].

## 2023-08-01 NOTE — Progress Notes (Signed)
ANTICOAGULATION CONSULT NOTE  Pharmacy Consult for heparin infusion Indication: ACS/STEMI  No Known Allergies  Patient Measurements: Height: 4\' 11"  (149.9 cm) Weight: 76.3 kg (168 lb 3.4 oz) IBW/kg (Calculated) : 47.7 Heparin Dosing Weight: 65.7 kg  Vital Signs: Temp: 97.6 F (36.4 C) (11/02 0342) Temp Source: Oral (11/02 0342) BP: 96/63 (11/02 0342) Pulse Rate: 76 (11/02 0342)  Labs: Recent Labs    07/30/23 0201 07/30/23 0426 07/30/23 1054 07/30/23 1705 07/30/23 2208 07/31/23 0511 08/01/23 0258  HGB  --  16.4  --   --   --  14.8 13.8  HCT  --  48.2  --   --   --  43.9 40.8  PLT  --  191  --   --   --  199 175  APTT 25  --   --   --   --   --   --   LABPROT 15.4*  --   --   --   --   --   --   INR 1.2  --   --   --   --   --   --   HEPARINUNFRC  --   --    < >  --  0.37 0.43 0.42  CREATININE  --  1.53*  --   --   --  1.40* 1.64*  TROPONINIHS 481* 793*  --  2,148* 1,943* 2,627*  --    < > = values in this interval not displayed.    Estimated Creatinine Clearance: 34 mL/min (A) (by C-G formula based on SCr of 1.64 mg/dL (H)).   Medical History: Past Medical History:  Diagnosis Date   Hypertension     Assessment: Pt is a 72 yo male w/ PMH of HTN, COPD presenting to ED c/o SOB & cough x 1 week, found with elevated BNP & Troponin I level.  Goal of Therapy:  Heparin level 0.3-0.7 units/ml Monitor platelets by anticoagulation protocol: Yes   1031 2208 HL 0.37, therapeutic x 1 1101 0511 HL 0.43, therapeutic x 2 1102 0258 HL 0.42, therapeutic x 3  Plan: heparin level therapeutic  ---continue heparin infusion rate at 1000 units/hr ---Will check HL daily w/ AM while therapeutic ---CBC once daily while on heparin  Otelia Sergeant, PharmD, [place] 08/01/2023 4:26 AM

## 2023-08-02 DIAGNOSIS — J441 Chronic obstructive pulmonary disease with (acute) exacerbation: Secondary | ICD-10-CM | POA: Diagnosis not present

## 2023-08-02 DIAGNOSIS — J9601 Acute respiratory failure with hypoxia: Secondary | ICD-10-CM | POA: Diagnosis not present

## 2023-08-02 DIAGNOSIS — I214 Non-ST elevation (NSTEMI) myocardial infarction: Secondary | ICD-10-CM | POA: Diagnosis not present

## 2023-08-02 DIAGNOSIS — I5021 Acute systolic (congestive) heart failure: Secondary | ICD-10-CM | POA: Diagnosis not present

## 2023-08-02 LAB — HEPARIN LEVEL (UNFRACTIONATED): Heparin Unfractionated: 0.42 [IU]/mL (ref 0.30–0.70)

## 2023-08-02 LAB — BASIC METABOLIC PANEL
Anion gap: 8 (ref 5–15)
BUN: 43 mg/dL — ABNORMAL HIGH (ref 8–23)
CO2: 22 mmol/L (ref 22–32)
Calcium: 8.5 mg/dL — ABNORMAL LOW (ref 8.9–10.3)
Chloride: 105 mmol/L (ref 98–111)
Creatinine, Ser: 1.54 mg/dL — ABNORMAL HIGH (ref 0.61–1.24)
GFR, Estimated: 48 mL/min — ABNORMAL LOW (ref >60)
Glucose, Bld: 185 mg/dL — ABNORMAL HIGH (ref 70–99)
Potassium: 4 mmol/L (ref 3.5–5.1)
Sodium: 135 mmol/L (ref 135–145)

## 2023-08-02 LAB — CBC
HCT: 44.5 % (ref 39.0–52.0)
Hemoglobin: 15 g/dL (ref 13.0–17.0)
MCH: 30.1 pg (ref 26.0–34.0)
MCHC: 33.7 g/dL (ref 30.0–36.0)
MCV: 89.4 fL (ref 80.0–100.0)
Platelets: 179 10*3/uL (ref 150–400)
RBC: 4.98 MIL/uL (ref 4.22–5.81)
RDW: 14 % (ref 11.5–15.5)
WBC: 10 10*3/uL (ref 4.0–10.5)
nRBC: 0 % (ref 0.0–0.2)

## 2023-08-02 MED ORDER — SODIUM CHLORIDE 0.9 % WEIGHT BASED INFUSION: 1.0000 mL/kg/h | INTRAVENOUS | Status: DC

## 2023-08-02 MED ORDER — GABAPENTIN 300 MG PO CAPS: 300.0000 mg | ORAL_CAPSULE | Freq: Two times a day (BID) | ORAL | Status: DC

## 2023-08-02 MED ORDER — SODIUM CHLORIDE 0.9 % WEIGHT BASED INFUSION
3.0000 mL/kg/h | INTRAVENOUS | Status: AC
Start: 2023-08-03 — End: 2023-08-03
  Administered 2023-08-03: 3 mL/kg/h via INTRAVENOUS

## 2023-08-02 MED ORDER — SODIUM CHLORIDE 0.9% FLUSH: 3.0000 mL | INTRAVENOUS | Status: DC | PRN

## 2023-08-02 MED ORDER — SODIUM CHLORIDE 0.9 % WEIGHT BASED INFUSION
1.0000 mL/kg/h | INTRAVENOUS | Status: DC
Start: 2023-08-03 — End: 2023-08-04
  Administered 2023-08-03 – 2023-08-04 (×2): 1 mL/kg/h via INTRAVENOUS

## 2023-08-02 MED ORDER — SODIUM CHLORIDE 0.9 % IV SOLN: 250.0000 mL | INTRAVENOUS | Status: AC | PRN

## 2023-08-02 MED ORDER — INDOMETHACIN 50 MG RE SUPP: 50.0000 mg | Freq: Three times a day (TID) | RECTAL | Status: DC | PRN

## 2023-08-02 MED ORDER — SODIUM CHLORIDE 0.9 % WEIGHT BASED INFUSION: 3.0000 mL/kg/h | INTRAVENOUS | Status: DC

## 2023-08-02 MED ORDER — ASPIRIN 81 MG PO CHEW
81.0000 mg | CHEWABLE_TABLET | ORAL | Status: AC
  Administered 2023-08-03: 81 mg via ORAL

## 2023-08-02 NOTE — Progress Notes (Signed)
Progress Note   Patient: Larry [name] JXB:[id number] DOB: Mar 01, 1951 DOA: 07/29/2023     3 DOS: the patient was seen and examined on 08/02/2023   Brief hospital course: Taken from H&P.  [name] is a 72 y.o. Caucasian male with medical history significant for essential hypertension, and COPD who presented to the emergency room with acute onset of worsening dyspnea with associated cough productive of clear sputum over the last couple weeks with no fever or chills. He admits to orthopnea and paroxysmal nocturnal dyspnea as well as dyspnea on exertion. He denied any worsening lower extremity edema.   On presentation he was found to have elevated blood pressure at 175/127, tachycardic at 138, tachypneic and hypoxic requiring BiPAP. ABG with pH of 7.44 and pCO2 of 30 with pO2 of 81 and O2 sat of 97.4% with bicarbonate of 20.  CBC showed mild hypokalemia of 3.4 and blood glucose of 189 with CO2 of 21 and AST 46 total protein 6.2 total bili 1.3 and BUN and creatinine were 16 and 1.44 above previous levels in 2020.  I sensitive troponin I was 286 and later 481 with a BNP of 1281.2.  CBC was within normal.  INR is 1.2 and PT 15.4 with PTT of 25.  Respiratory panel came back negative. EKG with sinus tachycardia and premature supraventricular complexes with RBBB and Q waves in septal leads, T wave inversion inferiorly and laterally. CXR with patchy airspace opacity may be due to atelectasis or pneumonia with suspected small bilateral pleural effusions.  The patient was given 40 mg of IV Lasix 125 mg of IV Solu-Medrol 2 g of IV magnesium sulfate and DuoNeb. He was later started on IV heparin with increasing troponin I.   10/31: Vital stable, able to wean off from BiPAP, currently on 4 L of oxygen with no baseline oxygen use.  Renal function seems to be at baseline of 1.4-1.5.  Slowly rising troponin currently at 793. Echocardiogram with a EF of 30 to 35%, global hypokinesis and mildly dilated LV  cavity.  Patient had normal EF on an echo done last year.  Cardiology is on board and likely will need ischemic workup.  Patient apparently stopped taking all of his medications at home for concern of long-term side effects.  11/1: Vital stable, some improvement in creatinine to 1.4, slight worsening of leukocytosis but patient is on steroid.  Troponin continued to rise at 2627, new diagnosis of HFrEF likely patient has NSTEMI.  Still significant orthopnea, cardiology would like to diurese more before taking him to Cath Lab.  11/2: Patient with some improved orthopnea, increasing creatinine so decreased Lasix to daily, cardiac cath on Monday.  11/3: Vital stable, creatinine with some improvement to 1.54 today.  Cath tomorrow.  Assessment and Plan: * Acute respiratory failure with hypoxia (HCC) Initially requiring BiPAP,  no baseline oxygen use.  Now on room air.  Likely due to acute heart failure, with concern of NSTEMI.  There was also some concern of COPD exacerbation on admission. -Continue supplemental oxygen as needed  Non-STEMI (non-ST elevated myocardial infarction) (HCC) Slowly worsening troponin, EKG changes and echocardiogram with new onset of HFrEF. -Continue with heparin infusion -Cardiology is on board-cardiac cath on Monday -Continue with beta-blocker and statin   Acute HFrEF (heart failure with reduced ejection fraction) (HCC) Echocardiogram today with EF of 30 to 35%, grade 1 diastolic dysfunction and global hypokinesis with mildly dilated LV.  Able to lay flat today. Prior echocardiogram last  year was normal. -Continue with IV diuresis- -Daily weight and BMP -Strict intake and output  COPD exacerbation (HCC) No wheezing today.  Improving oxygen requirement. -Converting Solu-Medrol with prednisone -Continue with supportive care  Chronic kidney disease, stage 3a (HCC) Creatinine seems to be around baseline of 1.4-1.5.  And currently stable -Monitor renal function  while patient is being diuresed -Avoid nephrotoxins  Peripheral neuropathy - We will need Neurontin.  Dyslipidemia - We will continue statin therapy. - Fasting lipids will be obtained.   Subjective: Patient was sleeping by lying flat today.  Easily arousable.  Denies any shortness of breath or chest pain.  Physical Exam: Vitals:   08/01/23 1937 08/02/23 0400 08/02/23 0745 08/02/23 1200  BP: 137/77 127/72 (!) 145/81 (P) 136/79  Pulse: (!) 44 71    Resp: 18 20 16  (P) 20  Temp: 98.3 F (36.8 C) 98.1 F (36.7 C) 98.1 F (36.7 C)   TempSrc: Oral Oral Oral   SpO2: 94% 94% 97% (P) 90%  Weight:  77.7 kg    Height:       General.  Obese elderly man, in no acute distress. Pulmonary.  Lungs clear bilaterally, normal respiratory effort. CV.  Regular rate and rhythm, no JVD, rub or murmur. Abdomen.  Soft, nontender, nondistended, BS positive. CNS.  Alert and oriented .  No focal neurologic deficit. Extremities.  No edema, no cyanosis, pulses intact and symmetrical. Psychiatry.  Judgment and insight appears normal.   Data Reviewed: Prior data reviewed  Family Communication: Discussed with sister at bedside  Disposition: Status is: Inpatient Remains inpatient appropriate because: Severity of illness  Planned Discharge Destination: Home  DVT prophylaxis.  Heparin infusion Time spent: 50 minutes  This record has been created using Conservation officer, historic buildings. Errors have been sought and corrected,but may not always be located. Such creation errors do not reflect on the standard of care.   Author: Arnetha Courser, MD 08/02/2023 2:26 PM  For on call review [email or web address].

## 2023-08-02 NOTE — Progress Notes (Signed)
ANTICOAGULATION CONSULT NOTE  Pharmacy Consult for heparin infusion Indication: ACS/STEMI  No Known Allergies  Patient Measurements: Height: 4\' 11"  (149.9 cm) Weight: 77.7 kg (171 lb 3.2 oz) IBW/kg (Calculated) : 47.7 Heparin Dosing Weight: 65.7 kg  Vital Signs: Temp: 98.1 F (36.7 C) (11/03 0400) Temp Source: Oral (11/03 0400) BP: 127/72 (11/03 0400) Pulse Rate: 71 (11/03 0400)  Labs: Recent Labs    07/30/23 1705 07/30/23 2208 07/30/23 2208 07/31/23 0511 08/01/23 0258 08/02/23 0536  HGB  --   --    < > 14.8 13.8 15.0  HCT  --   --   --  43.9 40.8 44.5  PLT  --   --   --  199 175 179  HEPARINUNFRC  --  0.37  --  0.43 0.42 0.42  CREATININE  --   --   --  1.40* 1.64*  --   TROPONINIHS 2,148* 1,943*  --  2,627*  --   --    < > = values in this interval not displayed.    Estimated Creatinine Clearance: 34.4 mL/min (A) (by C-G formula based on SCr of 1.64 mg/dL (H)).   Medical History: Past Medical History:  Diagnosis Date   Hypertension     Assessment: Pt is a 72 yo male w/ PMH of HTN, COPD presenting to ED c/o SOB & cough x 1 week, found with elevated BNP & Troponin I level.  Goal of Therapy:  Heparin level 0.3-0.7 units/ml Monitor platelets by anticoagulation protocol: Yes   1031 2208 HL 0.37, therapeutic x 1 1101 0511 HL 0.43, therapeutic x 2 1102 0258 HL 0.42, therapeutic x 3 1103 0536 HL 0.42, therapeutic x 4  Plan: heparin level therapeutic  ---continue heparin infusion rate at 1000 units/hr ---Will check HL daily w/ AM while therapeutic ---CBC once daily while on heparin  Otelia Sergeant, PharmD, [facility] 08/02/2023 6:28 AM

## 2023-08-02 NOTE — Progress Notes (Signed)
[facility] Cardiology    SUBJECTIVE: Patient slightly confused today more delirious denies any pain no shortness of breath but appears to be slightly more confused no fever chills or sweats   Vitals:   08/01/23 1937 08/02/23 0400 08/02/23 0745 08/02/23 1200  BP: 137/77 127/72 (!) 145/81 (P) 136/79  Pulse: (!) 44 71    Resp: 18 20 16  (P) 20  Temp: 98.3 F (36.8 C) 98.1 F (36.7 C) 98.1 F (36.7 C)   TempSrc: Oral Oral Oral   SpO2: 94% 94% 97% (P) 90%  Weight:  77.7 kg    Height:         Intake/Output Summary (Last 24 hours) at 08/02/2023 1302 Last data filed at 08/02/2023 0900 Gross per 24 hour  Intake 696.41 ml  Output 600 ml  Net 96.41 ml      PHYSICAL EXAM  General: Well developed, well nourished, in no acute distress HEENT:  Normocephalic and atramatic Neck:  No JVD.  Lungs: Clear bilaterally to auscultation and percussion. Heart: HRRR . Normal S1 and S2 without gallops or murmurs.  Abdomen: Bowel sounds are positive, abdomen soft and non-tender  Msk:  Back normal, normal gait. Normal strength and tone for age. Extremities: No clubbing, cyanosis or edema.   Neuro: Alert and mild confusion disorientation delirium  Psych:  Good affect, responds mild confusion delirium   LABS: Basic Metabolic Panel: Recent Labs    08/01/23 0258 08/02/23 0536  NA 134* 135  K 4.0 4.0  CL 100 105  CO2 23 22  GLUCOSE 283* 185*  BUN 45* 43*  CREATININE 1.64* 1.54*  CALCIUM 8.4* 8.5*   Liver Function Tests: No results for input(s): "AST", "ALT", "ALKPHOS", "BILITOT", "PROT", "ALBUMIN" in the last 72 hours. No results for input(s): "LIPASE", "AMYLASE" in the last 72 hours. CBC: Recent Labs    08/01/23 0258 08/02/23 0536  WBC 14.4* 10.0  HGB 13.8 15.0  HCT 40.8 44.5  MCV 89.9 89.4  PLT 175 179   Cardiac Enzymes: No results for input(s): "CKTOTAL", "CKMB", "CKMBINDEX", "TROPONINI" in the last 72 hours. BNP: Invalid input(s): "POCBNP" D-Dimer: No results for input(s):  "DDIMER" in the last 72 hours. Hemoglobin A1C: No results for input(s): "HGBA1C" in the last 72 hours. Fasting Lipid Panel: No results for input(s): "CHOL", "HDL", "LDLCALC", "TRIG", "CHOLHDL", "LDLDIRECT" in the last 72 hours. Thyroid Function Tests: No results for input(s): "TSH", "T4TOTAL", "T3FREE", "THYROIDAB" in the last 72 hours.  Invalid input(s): "FREET3" Anemia Panel: No results for input(s): "VITAMINB12", "FOLATE", "FERRITIN", "TIBC", "IRON", "RETICCTPCT" in the last 72 hours.  No results found.   Echo moderately depressed left ventricular function of 30 to 35%  TELEMETRY: Normal sinus rhythm rate of 75:  ASSESSMENT AND PLAN:  Principal Problem:   Acute respiratory failure with hypoxia (HCC) Active Problems:   Non-STEMI (non-ST elevated myocardial infarction) (HCC)   Dyslipidemia   Peripheral neuropathy   COPD exacerbation (HCC)   Acute congestive heart failure (HCC)   Acute HFrEF (heart failure with reduced ejection fraction) (HCC)   Chronic kidney disease, stage 3a (HCC)    Plan Agree with telemetry EKGs troponins Maintain heparin for anticoagulation post non-STEMI Renal insufficiency outpatient follow-up with nephrology maintain adequate hydration Statin therapy for hyperlipidemia management Acute on chronic congestive heart failure depression physical function continue medical therapy diuretic COPD by history recommend inhalers as necessary Obesity recommend modest weight loss exercise portion control Agree with cardiac cath on Monday for evaluation of lower extremity Altered mental status unclear  etiology family states no significant alcohol abuse no clear evidence of infection continue to further evaluate for confusion Discontinue Indocin and make it as needed for gout symptoms concerned about its effect on renal function Continue aggressive medical therapy for now   Alwyn Pea, MD 08/02/2023 1:02 PM

## 2023-08-03 DIAGNOSIS — J441 Chronic obstructive pulmonary disease with (acute) exacerbation: Secondary | ICD-10-CM | POA: Diagnosis not present

## 2023-08-03 DIAGNOSIS — J9601 Acute respiratory failure with hypoxia: Secondary | ICD-10-CM | POA: Diagnosis not present

## 2023-08-03 DIAGNOSIS — I214 Non-ST elevation (NSTEMI) myocardial infarction: Secondary | ICD-10-CM | POA: Diagnosis not present

## 2023-08-03 DIAGNOSIS — I5021 Acute systolic (congestive) heart failure: Secondary | ICD-10-CM | POA: Diagnosis not present

## 2023-08-03 LAB — BASIC METABOLIC PANEL
Anion gap: 10 (ref 5–15)
BUN: 41 mg/dL — ABNORMAL HIGH (ref 8–23)
CO2: 23 mmol/L (ref 22–32)
Calcium: 8.5 mg/dL — ABNORMAL LOW (ref 8.9–10.3)
Chloride: 102 mmol/L (ref 98–111)
Creatinine, Ser: 1.44 mg/dL — ABNORMAL HIGH (ref 0.61–1.24)
GFR, Estimated: 52 mL/min — ABNORMAL LOW (ref >60)
Glucose, Bld: 191 mg/dL — ABNORMAL HIGH (ref 70–99)
Potassium: 4 mmol/L (ref 3.5–5.1)
Sodium: 135 mmol/L (ref 135–145)

## 2023-08-03 LAB — CBC
HCT: 41.1 % (ref 39.0–52.0)
Hemoglobin: 14.2 g/dL (ref 13.0–17.0)
MCH: 30.4 pg (ref 26.0–34.0)
MCHC: 34.5 g/dL (ref 30.0–36.0)
MCV: 88 fL (ref 80.0–100.0)
Platelets: 158 10*3/uL (ref 150–400)
RBC: 4.67 MIL/uL (ref 4.22–5.81)
RDW: 13.9 % (ref 11.5–15.5)
WBC: 12.3 10*3/uL — ABNORMAL HIGH (ref 4.0–10.5)
nRBC: 0 % (ref 0.0–0.2)

## 2023-08-03 LAB — GLUCOSE, CAPILLARY
Glucose-Capillary: 165 mg/dL — ABNORMAL HIGH (ref 70–99)
Glucose-Capillary: 182 mg/dL — ABNORMAL HIGH (ref 70–99)
Glucose-Capillary: 159 mg/dL — ABNORMAL HIGH (ref 70–99)

## 2023-08-03 LAB — HEPARIN LEVEL (UNFRACTIONATED): Heparin Unfractionated: 0.61 [IU]/mL (ref 0.30–0.70)

## 2023-08-03 MED ORDER — METOPROLOL SUCCINATE ER 100 MG PO TB24: 100.0000 mg | ORAL_TABLET | Freq: Every day | ORAL | Status: DC

## 2023-08-03 MED ORDER — INSULIN ASPART 100 UNIT/ML IJ SOLN
0.0000 [IU] | Freq: Three times a day (TID) | INTRAMUSCULAR | Status: DC
  Administered 2023-08-03: 2 [IU] via SUBCUTANEOUS
  Administered 2023-08-05: 1 [IU] via SUBCUTANEOUS
  Administered 2023-08-05: 2 [IU] via SUBCUTANEOUS
  Administered 2023-08-05: 3 [IU] via SUBCUTANEOUS
  Administered 2023-08-06 (×2): 2 [IU] via SUBCUTANEOUS
  Filled 2023-08-03 (×6): qty 1

## 2023-08-03 MED ORDER — METOPROLOL SUCCINATE ER 100 MG PO TB24
100.0000 mg | ORAL_TABLET | Freq: Every day | ORAL | Status: DC
  Administered 2023-08-04: 100 mg via ORAL
  Filled 2023-08-03: qty 1

## 2023-08-03 MED ORDER — INSULIN ASPART 100 UNIT/ML IJ SOLN: 0.0000 [IU] | Freq: Every day | INTRAMUSCULAR | Status: DC

## 2023-08-03 NOTE — Progress Notes (Signed)
Progress Note   Patient: Larry [name] NWG:[id number] DOB: 12-05-1950 DOA: 07/29/2023     4 DOS: the patient was seen and examined on 08/03/2023   Brief hospital course: Taken from H&P.  Larry [name] is a 72 y.o. Caucasian male with medical history significant for essential hypertension, and COPD who presented to the emergency room with acute onset of worsening dyspnea with associated cough productive of clear sputum over the last couple weeks with no fever or chills. He admits to orthopnea and paroxysmal nocturnal dyspnea as well as dyspnea on exertion. He denied any worsening lower extremity edema.   On presentation he was found to have elevated blood pressure at 175/127, tachycardic at 138, tachypneic and hypoxic requiring BiPAP. ABG with pH of 7.44 and pCO2 of 30 with pO2 of 81 and O2 sat of 97.4% with bicarbonate of 20.  CBC showed mild hypokalemia of 3.4 and blood glucose of 189 with CO2 of 21 and AST 46 total protein 6.2 total bili 1.3 and BUN and creatinine were 16 and 1.44 above previous levels in 2020.  I sensitive troponin I was 286 and later 481 with a BNP of 1281.2.  CBC was within normal.  INR is 1.2 and PT 15.4 with PTT of 25.  Respiratory panel came back negative. EKG with sinus tachycardia and premature supraventricular complexes with RBBB and Q waves in septal leads, T wave inversion inferiorly and laterally. CXR with patchy airspace opacity may be due to atelectasis or pneumonia with suspected small bilateral pleural effusions.  The patient was given 40 mg of IV Lasix 125 mg of IV Solu-Medrol 2 g of IV magnesium sulfate and DuoNeb. He was later started on IV heparin with increasing troponin I.   10/31: Vital stable, able to wean off from BiPAP, currently on 4 L of oxygen with no baseline oxygen use.  Renal function seems to be at baseline of 1.4-1.5.  Slowly rising troponin currently at 793. Echocardiogram with a EF of 30 to 35%, global hypokinesis and mildly dilated LV  cavity.  Patient had normal EF on an echo done last year.  Cardiology is on board and likely will need ischemic workup.  Patient apparently stopped taking all of his medications at home for concern of long-term side effects.  11/1: Vital stable, some improvement in creatinine to 1.4, slight worsening of leukocytosis but patient is on steroid.  Troponin continued to rise at 2627, new diagnosis of HFrEF likely patient has NSTEMI.  Still significant orthopnea, cardiology would like to diurese more before taking him to Cath Lab.  11/2: Patient with some improved orthopnea, increasing creatinine so decreased Lasix to daily, cardiac cath on Monday.  11/3: Vital stable, creatinine with some improvement to 1.54 today.  Cath tomorrow.  11/4: Vital stable, orthopnea improved.  Cardiac cath which was originally planned for today got canceled for tomorrow.  Assessment and Plan: * Acute respiratory failure with hypoxia (HCC) Initially requiring BiPAP,  no baseline oxygen use.  Now on room air.  Likely due to acute heart failure, with concern of NSTEMI.  There was also some concern of COPD exacerbation on admission. -Continue supplemental oxygen as needed  Non-STEMI (non-ST elevated myocardial infarction) (HCC) Slowly worsening troponin, EKG changes and echocardiogram with new onset of HFrEF. -Continue with heparin infusion -Cardiology is on board-cardiac cath on Monday -Continue with beta-blocker and statin   Acute HFrEF (heart failure with reduced ejection fraction) (HCC) Echocardiogram today with EF of 30 to 35%, grade  1 diastolic dysfunction and global hypokinesis with mildly dilated LV.  Able to lay flat today. Prior echocardiogram last year was normal. -Continue with IV diuresis- -Daily weight and BMP -Strict intake and output  COPD exacerbation (HCC) No wheezing today.  Improving oxygen requirement. -Converting Solu-Medrol with prednisone -Continue with supportive care  Chronic kidney  disease, stage 3a (HCC) Creatinine seems to be around baseline of 1.4-1.5.  And currently stable -Monitor renal function while patient is being diuresed -Avoid nephrotoxins  Peripheral neuropathy - We will need Neurontin.  Dyslipidemia - We will continue statin therapy. - Fasting lipids will be obtained.   Subjective: Patient was sitting at the side of the bed when seen today.  Denies any chest pain or shortness of breath.  Physical Exam: Vitals:   08/03/23 0301 08/03/23 0812 08/03/23 0814 08/03/23 1221  BP: 122/77 129/86  (!) 131/97  Pulse: 64 75 78 63  Resp: 17     Temp: 97.8 F (36.6 C) (!) 97.2 F (36.2 C)    TempSrc:      SpO2: 96% 96% 95% 93%  Weight:      Height:       General.  Obese gentleman, in no acute distress. Pulmonary.  Few basal crackles bilaterally, normal respiratory effort. CV.  Regular rate and rhythm, no JVD, rub or murmur. Abdomen.  Soft, nontender, nondistended, BS positive. CNS.  Alert and oriented .  No focal neurologic deficit. Extremities.  Trace LE edema, no cyanosis, pulses intact and symmetrical.  Data Reviewed: Prior data reviewed  Family Communication: Discussed with sister at bedside  Disposition: Status is: Inpatient Remains inpatient appropriate because: Severity of illness  Planned Discharge Destination: Home  DVT prophylaxis.  Heparin infusion Time spent: 50 minutes  This record has been created using Conservation officer, historic buildings. Errors have been sought and corrected,but may not always be located. Such creation errors do not reflect on the standard of care.   Author: Arnetha Courser, MD 08/03/2023 2:33 PM  For on call review [email or web address].

## 2023-08-03 NOTE — Plan of Care (Signed)
  Problem: Clinical Measurements: Goal: Will remain free from infection Outcome: Progressing Goal: Diagnostic test results will improve Outcome: Progressing Goal: Respiratory complications will improve Outcome: Progressing Goal: Cardiovascular complication will be avoided Outcome: Progressing   Problem: Activity: Goal: Risk for activity intolerance will decrease Outcome: Progressing   

## 2023-08-03 NOTE — Progress Notes (Signed)
[facility]IN[facility] CARDIOLOGY CONSULT NOTE       Patient ID: Larry [name] MRN: [id number] DOB/AGE: 1950-10-01 72 y.o.  Admit date: 07/29/2023 Referring Physician Valente David, MD Primary Physician Rayetta Humphrey, MD Primary Cardiologist Dorothyann Peng, MD Reason for Consultation NSTEMI  HPI: Larry [name] is a 72 y.o. male  with a past medical history of hyperlipidemia, hypertension, type 2 diabetes mellitus, stroke, neuropathy, RLS, abdominal hernia, GERD who presented to the ED on 07/29/2023 for shortness of breath and cough for 2 weeks. Cardiology was consulted for further evaluation.   Interval history: -Patient confused this AM, reportedly better than yesterday per pt sister and RN.  -BP and HR remain stable. Continues to report SOB. Denies CP.  -Continues to have good UOP with IV diuresis. Renal function mildly improved today.   Review of systems complete and found to be negative unless listed above    Past Medical History:  Diagnosis Date   Hypertension     History reviewed. No pertinent surgical history.  Medications Prior to Admission  Medication Sig Dispense Refill Last Dose   albuterol (VENTOLIN HFA) 108 (90 Base) MCG/ACT inhaler Inhale 2 puffs into the lungs every 4 (four) hours as needed for wheezing or shortness of breath.   prn at unknown   famotidine (PEPCID) 20 MG tablet Take 20 mg by mouth 2 (two) times daily.   07/29/2023   rosuvastatin (CRESTOR) 40 MG tablet Take 40 mg by mouth daily.   07/29/2023   allopurinol (ZYLOPRIM) 100 MG tablet Take 200 mg by mouth daily. (Patient not taking: Reported on 07/30/2023)   Not Taking   amLODipine (NORVASC) 10 MG tablet Take 10 mg by mouth daily. (Patient not taking: Reported on 07/30/2023)   Not Taking   gabapentin (NEURONTIN) 300 MG capsule Take 300 mg by mouth 3 (three) times daily. (Patient not taking: Reported on 07/30/2023)   Not Taking   hydrALAZINE (APRESOLINE) 100 MG tablet Take 100 mg by mouth 2 (two) times  daily. (Patient not taking: Reported on 07/30/2023)   Not Taking   indomethacin (INDOCIN) 25 MG capsule Take 25 mg by mouth 2 (two) times daily with a meal. (Patient not taking: Reported on 07/30/2023)   Not Taking   lisinopril (ZESTRIL) 40 MG tablet Take 40 mg by mouth daily. (Patient not taking: Reported on 07/30/2023)   Not Taking   lisinopril-hydrochlorothiazide (ZESTORETIC) 20-25 MG tablet Take 1 tablet by mouth daily. (Patient not taking: Reported on 07/30/2023)   Not Taking   metFORMIN (GLUCOPHAGE-XR) 750 MG 24 hr tablet Take 750 mg by mouth 2 (two) times daily. (Patient not taking: Reported on 07/30/2023)   Not Taking   metoprolol tartrate (LOPRESSOR) 100 MG tablet Take 100 mg by mouth 2 (two) times daily. (Patient not taking: Reported on 07/30/2023)   Not Taking   montelukast (SINGULAIR) 10 MG tablet Take 10 mg by mouth daily. (Patient not taking: Reported on 07/30/2023)   Not Taking   potassium chloride (K-DUR) 10 MEQ tablet Take 10 mEq by mouth daily. (Patient not taking: Reported on 07/30/2023)   Not Taking   primidone (MYSOLINE) 50 MG tablet Take by mouth 4 (four) times daily. (Patient not taking: Reported on 07/30/2023)   Not Taking   Social History   Socioeconomic History   Marital status: Married    Spouse name: Not on file   Number of children: Not on file   Years of education: Not on file   Highest education level:  Not on file  Occupational History   Not on file  Tobacco Use   Smoking status: Never   Smokeless tobacco: Current  Substance and Sexual Activity   Alcohol use: Yes   Drug use: Never   Sexual activity: Not on file  Other Topics Concern   Not on file  Social History Narrative   Not on file   Social Determinants of Health   Financial Resource Strain: Medium Risk (01/20/2023)   Received from [facility]   Overall Financial Resource Strain (CARDIA)    Difficulty of Paying Living Expenses: Somewhat hard  Food Insecurity: No Food Insecurity  (07/31/2023)   Hunger Vital Sign    Worried About Running Out of Food in the Last Year: Never true    Ran Out of Food in the Last Year: Never true  Transportation Needs: No Transportation Needs (07/31/2023)   PRAPARE - Administrator, Civil Service (Medical): No    Lack of Transportation (Non-Medical): No  Physical Activity: Not on file  Stress: Not on file  Social Connections: Not on file  Intimate Partner Violence: Not At Risk (07/31/2023)   Humiliation, Afraid, Rape, and Kick questionnaire    Fear of Current or Ex-Partner: No    Emotionally Abused: No    Physically Abused: No    Sexually Abused: No    History reviewed. No pertinent family history.   Vitals:   08/03/23 0301 08/03/23 0812 08/03/23 0814 08/03/23 1221  BP: 122/77 129/86  (!) 131/97  Pulse: 64 75 78 63  Resp: 17     Temp: 97.8 F (36.6 C) (!) 97.2 F (36.2 C)    TempSrc:      SpO2: 96% 96% 95% 93%  Weight:      Height:        PHYSICAL EXAM General: Elderly appearing male, well nourished, in no acute sitting upright in bedside chair. HEENT: Normocephalic and atraumatic. Neck: No JVD.   Lungs: Normal respiratory effort on room air. Clear bilaterally to auscultation. No wheezes. Mild crackles bilaterally. Heart: HRRR. Normal S1 and S2 without gallops or murmurs. Mild bibasilar crackles, improved overall.  Abdomen: Non-distended appearing.  Msk: Normal strength and tone for age. Extremities: Warm and well perfused. No clubbing, cyanosis. trace edema.  Neuro: Alert and oriented X 3. Psych: Answers questions appropriately.   Labs: Basic Metabolic Panel: Recent Labs    08/02/23 0536 08/03/23 0442  NA 135 135  K 4.0 4.0  CL 105 102  CO2 22 23  GLUCOSE 185* 191*  BUN 43* 41*  CREATININE 1.54* 1.44*  CALCIUM 8.5* 8.5*   Liver Function Tests: No results for input(s): "AST", "ALT", "ALKPHOS", "BILITOT", "PROT", "ALBUMIN" in the last 72 hours.  No results for input(s): "LIPASE", "AMYLASE" in  the last 72 hours. CBC: Recent Labs    08/02/23 0536 08/03/23 0442  WBC 10.0 12.3*  HGB 15.0 14.2  HCT 44.5 41.1  MCV 89.4 88.0  PLT 179 158   Cardiac Enzymes: No results for input(s): "CKTOTAL", "CKMB", "CKMBINDEX", "TROPONINIHS" in the last 72 hours.  BNP: No results for input(s): "BNP" in the last 72 hours.  D-Dimer: No results for input(s): "DDIMER" in the last 72 hours. Hemoglobin A1C: No results for input(s): "HGBA1C" in the last 72 hours. Fasting Lipid Panel: No results for input(s): "CHOL", "HDL", "LDLCALC", "TRIG", "CHOLHDL", "LDLDIRECT" in the last 72 hours. Thyroid Function Tests: No results for input(s): "TSH", "T4TOTAL", "T3FREE", "THYROIDAB" in the last 72 hours.  Invalid input(s): "FREET3" Anemia Panel: No results for input(s): "VITAMINB12", "FOLATE", "FERRITIN", "TIBC", "IRON", "RETICCTPCT" in the last 72 hours.   Radiology: ECHOCARDIOGRAM COMPLETE  Result Date: 07/30/2023    ECHOCARDIOGRAM REPORT   Patient Name:   LUISFERNANDO BRIGHTWELL [name] Date of Exam: 07/30/2023 Medical Rec #:  [id number]          Height:       59.0 in Accession #:    [id number]         Weight:       176.0 lb Date of Birth:  Feb 13, 1951          BSA:          1.747 m Patient Age:    72 years           BP:           124/102 mmHg Patient Gender: M                  HR:           118 bpm. Exam Location:  [facility] Procedure: 2D Echo, Cardiac Doppler, Color Doppler and Intracardiac            Opacification Agent Indications:     CHF  History:         Patient has no prior history of Echocardiogram examinations.                  CHF, Acute MI, COPD; Risk Factors:Dyslipidemia.  Sonographer:     Mikki Harbor Referring Phys:  [id number] JAN A MANSY Diagnosing Phys: Julien Nordmann MD  Sonographer Comments: Image acquisition challenging due to COPD. IMPRESSIONS  1. Left ventricular ejection fraction, by estimation, is 30 to 35%. Left ventricular ejection fraction by PLAX is 32 %. The left ventricle has moderately  decreased function. The left ventricle demonstrates global hypokinesis. The left ventricular internal cavity size was mildly dilated. Left ventricular diastolic parameters are indeterminate.  2. Right ventricular systolic function is normal. The right ventricular size is normal. Tricuspid regurgitation signal is inadequate for assessing PA pressure. The estimated right ventricular systolic pressure is 17.4 mmHg.  3. The mitral valve is normal in structure. Mild to moderate mitral valve regurgitation. No evidence of mitral stenosis.  4. The aortic valve is normal in structure. Aortic valve regurgitation is mild. No aortic stenosis is present.  5. The inferior vena cava is normal in size with greater than 50% respiratory variability, suggesting right atrial pressure of 3 mmHg. FINDINGS  Left Ventricle: Left ventricular ejection fraction, by estimation, is 30 to 35%. Left ventricular ejection fraction by PLAX is 32 %. The left ventricle has moderately decreased function. The left ventricle demonstrates global hypokinesis. Definity contrast agent was given IV to delineate the left ventricular endocardial borders. The left ventricular internal cavity size was mildly dilated. There is no left ventricular hypertrophy. Left ventricular diastolic parameters are indeterminate. Right Ventricle: The right ventricular size is normal. No increase in right ventricular wall thickness. Right ventricular systolic function is normal. Tricuspid regurgitation signal is inadequate for assessing PA pressure. The tricuspid regurgitant velocity is 1.90 m/s, and with an assumed right atrial pressure of 3 mmHg, the estimated right ventricular systolic pressure is 17.4 mmHg. Left Atrium: Left atrial size was normal in size. Right Atrium: Right atrial size was normal in size. Pericardium: There is no evidence of pericardial effusion. Mitral Valve: The mitral valve is normal in structure. Mild to moderate mitral valve regurgitation. No evidence  of mitral valve stenosis. MV peak gradient, 5.5 mmHg. The mean mitral valve gradient is 2.0 mmHg. Tricuspid Valve: The tricuspid valve is normal in structure. Tricuspid valve regurgitation is mild . No evidence of tricuspid stenosis. Aortic Valve: The aortic valve is normal in structure. Aortic valve regurgitation is mild. No aortic stenosis is present. Aortic valve mean gradient measures 3.0 mmHg. Aortic valve peak gradient measures 4.8 mmHg. Aortic valve area, by VTI measures 2.39 cm. Pulmonic Valve: The pulmonic valve was normal in structure. Pulmonic valve regurgitation is not visualized. No evidence of pulmonic stenosis. Aorta: The aortic root is normal in size and structure. Venous: The inferior vena cava is normal in size with greater than 50% respiratory variability, suggesting right atrial pressure of 3 mmHg. IAS/Shunts: No atrial level shunt detected by color flow Doppler.  LEFT VENTRICLE PLAX 2D LV EF:         Left            Diastology                ventricular     LV e' medial:    10.80 cm/s                ejection        LV E/e' medial:  9.1                fraction by     LV e' lateral:   9.14 cm/s                PLAX is 32      LV E/e' lateral: 10.8                %. LVIDd:         4.70 cm LVIDs:         4.00 cm LV PW:         1.30 cm LV IVS:        1.20 cm LVOT diam:     2.00 cm LV SV:         43 LV SV Index:   25 LVOT Area:     3.14 cm  LV Volumes (MOD) LV vol d, MOD    96.6 ml A4C: LV vol s, MOD    63.9 ml A4C: LV SV MOD A4C:   96.6 ml RIGHT VENTRICLE RV Basal diam:  3.20 cm RV Mid diam:    2.80 cm RV S prime:     12.60 cm/s LEFT ATRIUM             Index        RIGHT ATRIUM           Index LA diam:        3.80 cm 2.18 cm/m   RA Area:     12.80 cm LA Vol (A2C):   47.7 ml 27.31 ml/m  RA Volume:   33.40 ml  19.12 ml/m LA Vol (A4C):   53.4 ml 30.57 ml/m LA Biplane Vol: 51.4 ml 29.42 ml/m  AORTIC VALVE                    PULMONIC VALVE AV Area (Vmax):    2.46 cm     PV Vmax:       0.75 m/s AV  Area (Vmean):   2.12 cm     PV Peak grad:  2.3 mmHg AV Area (VTI):     2.39 cm AV Vmax:  109.50 cm/s AV Vmean:          81.750 cm/s AV VTI:            0.180 m AV Peak Grad:      4.8 mmHg AV Mean Grad:      3.0 mmHg LVOT Vmax:         85.67 cm/s LVOT Vmean:        55.067 cm/s LVOT VTI:          0.136 m LVOT/AV VTI ratio: 0.76  AORTA Ao Root diam: 3.30 cm MITRAL VALVE               TRICUSPID VALVE MV Area (PHT): 7.32 cm    TR Peak grad:   14.4 mmHg MV Area VTI:   1.95 cm    TR Vmax:        190.00 cm/s MV Peak grad:  5.5 mmHg MV Mean grad:  2.0 mmHg    SHUNTS MV Vmax:       1.17 m/s    Systemic VTI:  0.14 m MV Vmean:      65.6 cm/s   Systemic Diam: 2.00 cm MV Decel Time: 104 msec MV E velocity: 98.38 cm/s MV A velocity: 84.35 cm/s MV E/A ratio:  1.17 Julien Nordmann MD Electronically signed by Julien Nordmann MD Signature Date/Time: 07/30/2023/1:36:14 PM    Final    DG Chest 1 View  Result Date: 07/30/2023 CLINICAL DATA:  Shortness of breath EXAM: CHEST  1 VIEW COMPARISON:  05/19/2014 FINDINGS: Low lung volumes accentuate pulmonary vascularity. Normal cardiomediastinal silhouette. Patchy bilateral airspace opacities. Retrocardiac atelectasis or consolidation. Suspected small bilateral pleural effusions. No pneumothorax. No displaced rib fractures. IMPRESSION: Patchy airspace opacities may be due to atelectasis or pneumonia. Suspected small bilateral pleural effusions. Electronically Signed   By: Minerva Fester M.D.   On: 07/30/2023 02:30    ECHO As above (EF 30-35%)  TELEMETRY reviewed by me 08/03/2023: sinus rhythm PACs rate 70s  EKG reviewed by me: Sinus tachy with PACs, RBBB, rate 133 bpm (non-acute)  Pertinent Cardiac History CT Coronary 11/24/2022: Aorta normal caliber without dissection. Atherosclerotic calcifications descending thoracic aorta. No pericardial effusion. Visualized pulmonary arteries patent.  Stress Test 08/07/2022:  Abnormal myocardial perfusion scar large area of  anterior apical inferior apical defect with significant reversibility consistent with ischemia overall left ventricular function is normal around 59% no clear wall motion abnormality study is consistent with ischemia this is a  high risk scan recommend further evaluation invasively. Regional wall motion reveals normal myocardial thickening and wall motion. LVEF 59%  Data reviewed by me 08/03/2023: last 24h vitals tele labs imaging I/O hospitalist progress note  Principal Problem:   Acute respiratory failure with hypoxia (HCC) Active Problems:   Non-STEMI (non-ST elevated myocardial infarction) (HCC)   Dyslipidemia   Peripheral neuropathy   COPD exacerbation (HCC)   Acute congestive heart failure (HCC)   Acute HFrEF (heart failure with reduced ejection fraction) (HCC)   Chronic kidney disease, stage 3a (HCC)    ASSESSMENT AND PLAN:  LOVEL SUAZO [name] is a 72 y.o. male  with a past medical history of hyperlipidemia, hypertension, type 2 diabetes mellitus, stroke, neuropathy, RLS, abdominal hernia, GERD who presented to the ED on 07/29/2023 for shortness of breath and cough for 2 weeks. Cardiology was consulted for further evaluation.   # Acute respiratory failure with hypoxia  # Acute HFrEF (EF 30-35%) # Hypertension Upon admission patient reports worsening shortness of breath for  past 2 weeks. Patient states he can't walk without getting SOB and this is new for him. BNP 1,281. Patient was placed on 4L [place] and not on O2 at baseline, did require BiPAP initially in the ED. Echo this admission with newly reduced EF to 30-35%. Net negative 2.8 L since admission.  -Continue diuresis with IV lasix 40 mg daily. -Switch metoprolol to succinate 100 mg daily.  -Continue lisinopril 10 mg daily.   #NSTEMI #Hyperlipidemia Patient reporting worsening DOE for 2 weeks. Denies chest pain or pressure. Upon admission patient has been tachycardiac with blood pressure slightly elevated. Troponin 286 > 481 >  793. Patient received 324 mg ASA. EKG showed sinus tachy with PACs, RBBB, rate 133 bpm, which is non-acute. Most recent lipid panel 06/2023 with TC 109 and LDL 46. -Continue heparin drip. -Continue rosuvastatin 40 mg  -Plan for [facility] tomorrow for further evaluation. Postponed today due to confusion.   #Chronic kidney disease 3a Cr up to 1.44 on presentation, variable throughout admission. Up to 1.64 on 11/2 now downtrending to 1.44 on AM labs. Baseline around 1.4-1.5.  -Monitor renal function closely with diureses.  -Monitor electrolytes, strict I's & O's, daily weights      This patient's plan of care was discussed and created with Dr. Melton Alar and she is in agreement.  Signed: Gale Journey, PA-C  08/03/2023, 2:26 PM [facility] Cardiology

## 2023-08-03 NOTE — Progress Notes (Signed)
ANTICOAGULATION CONSULT NOTE  Pharmacy Consult for heparin infusion Indication: ACS/STEMI  No Known Allergies  Patient Measurements: Height: 4\' 11"  (149.9 cm) Weight: 77.7 kg (171 lb 3.2 oz) IBW/kg (Calculated) : 47.7 Heparin Dosing Weight: 65.7 kg  Vital Signs: Temp: 97.8 F (36.6 C) (11/04 0301) Temp Source: Oral (11/03 2317) BP: 122/77 (11/04 0301) Pulse Rate: 64 (11/04 0301)  Labs: Recent Labs    08/01/23 0258 08/02/23 0536 08/03/23 0442  HGB 13.8 15.0 14.2  HCT 40.8 44.5 41.1  PLT 175 179 158  HEPARINUNFRC 0.42 0.42 0.61  CREATININE 1.64* 1.54* 1.44*    Estimated Creatinine Clearance: 39.2 mL/min (A) (by C-G formula based on SCr of 1.44 mg/dL (H)).   Medical History: Past Medical History:  Diagnosis Date   Hypertension     Assessment: Pt is a 72 yo male w/ PMH of HTN, COPD presenting to ED c/o SOB & cough x 1 week, found with elevated BNP & Troponin I level.  Goal of Therapy:  Heparin level 0.3-0.7 units/ml Monitor platelets by anticoagulation protocol: Yes   1031 2208 HL 0.37, therapeutic x 1 1101 0511 HL 0.43, therapeutic x 2 1102 0258 HL 0.42, therapeutic x 3 1103 0536 HL 0.42, therapeutic x 4 1104 0442 HL 0.61, therapeutic x 5  Plan: heparin level therapeutic  ---continue heparin infusion rate at 1000 units/hr ---Will check HL daily w/ AM while therapeutic ---CBC once daily while on heparin  Otelia Sergeant, PharmD, [facility] 08/03/2023 6:18 AM

## 2023-08-03 NOTE — Progress Notes (Signed)
Heart Failure Stewardship Pharmacy Note  PCP: Rayetta Humphrey, MD PCP-Cardiologist: None  HPI: Larry [name] is a 72 y.o. male with hypertension, dyslipidemia, CAD, CVA and COPD who presented with worsening of exertional dyspnea over the past 2 weeks. Arrived to the emergency department with tachycardia, hypertension, and hypoxia requiring BIPAP.  Troponin 2148 on admission, now 2627. BNP on admission significantly elevated to 1281.2. TTE this admission showed LVEF of 30-35%, mild-moderate MR.  Pertinent cardiac history: Stress test and TTE in 05/2014 with normal LVEF and no evidence of ischemia. Stress test 07/2022 showed perfusion scar area of anterior apical inferior defect consistent with ischemia and LVEF was 59%. TTE 07/2022 showed normal LVEF.   Pertinent Lab Values: Creatinine  Date Value Ref Range Status  05/19/2014 1.78 (H) 0.60 - 1.30 mg/dL Final   Creatinine, Ser  Date Value Ref Range Status  08/03/2023 1.44 (H) 0.61 - 1.24 mg/dL Final   BUN  Date Value Ref Range Status  08/03/2023 41 (H) 8 - 23 mg/dL Final  16/06/9603 19 (H) 7 - 18 mg/dL Final   Potassium  Date Value Ref Range Status  08/03/2023 4.0 3.5 - 5.1 mmol/L Final  05/19/2014 3.4 (L) 3.5 - 5.1 mmol/L Final   Sodium  Date Value Ref Range Status  08/03/2023 135 135 - 145 mmol/L Final  05/19/2014 141 136 - 145 mmol/L Final   B Natriuretic Peptide  Date Value Ref Range Status  07/30/2023 1,281.2 (H) 0.0 - 100.0 pg/mL Final    Comment:    Performed at [facility], [address] [place], [place] [address]   Magnesium  Date Value Ref Range Status  07/30/2023 2.0 1.7 - 2.4 mg/dL Final    Comment:    Performed at [facility], [address] [place], [place] [address]   Hgb A1c MFr Bld  Date Value Ref Range Status  07/30/2023 6.2 (H) 4.8 - 5.6 % Final    Comment:    (NOTE) Pre diabetes:          5.7%-6.4%  Diabetes:              >6.4%  Glycemic control for    <7.0% adults with diabetes     Vital Signs: Admission weight: 176 lbs Temp:  [97.2 F (36.2 C)-97.8 F (36.6 C)] 97.2 F (36.2 C) (11/04 0812) Pulse Rate:  [52-88] 78 (11/04 0814) Cardiac Rhythm: Normal sinus rhythm (11/04 0700) Resp:  [16-20] 17 (11/04 0301) BP: (115-141)/(77-95) 129/86 (11/04 0812) SpO2:  [90 %-96 %] 95 % (11/04 0814)  Intake/Output Summary (Last 24 hours) at 08/03/2023 1042 Last data filed at 08/02/2023 2109 Gross per 24 hour  Intake --  Output 200 ml  Net -200 ml    Current Heart Failure Medications:  Loop diuretic: furosemide 40 mg IV q12h Beta-Blocker: metoprolol tartrate 100 mg BID ACEI/ARB/ARNI: lisinopril 10 mg daily MRA: none SGLT2i: none  Prior to admission Heart Failure Medications:  Loop diuretic: none Beta-Blocker: metoprolol tartrate 100 mg BID  ACEI/ARB/ARNI: lisinopril 10 mg daily MRA: none SGLT2i: none Other vasoactive medications include amlodipine 10 mg daily  Assessment: 1. 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 NYHA class IIIB symptoms.  -Symptoms: Reports shortness of breath has improved with diuresis. Still making decent urine. Experienced AMS today and yesterday. This could be secondary to primidone use or low output heart failure. Primidone stopped.   -Volume:  No significant lower extremity edema noted on exam. Creatinine is improving with diuresis. Urine is light yellow. Patient reports good urine output. Doubt I/Os are complete. -Hemodynamics: BP is much improved after adding home medications. Patient is no longer tachycardic, however this may be due to restarting home metoprolol -BB: Home metoprolol 100 mg BID resumed. In case the patient has low output would recommend reduction by 50% . Will need to be converted to metoprolol succinate before discharge. -ACEI/ARB/ARNI: Continue lisinopril 10 mg daily. Would consider transition to [facility],  however patient has not met his $545 deductible and would have to pay it again in January. Can have a risk benefit discussion after cath. -MRA: Spironolactone can be added after cath to help maintain potassium >4, augment diuresis, and provide mortality reduction in HFrEF. -SGLT2i: Can consider adding after cath if creatinine is stable to augment diuresis. Again, the high deductible may limit use.  Plan: 1) Medication changes recommended at this time: -Can consider reducing metoprolol tartrate to metoprolol succinate 100 mg daily.  -F/u after heart catheterization.   2) Patient assistance: -Has not yet met $545 deductible. -Copay for Sherryll Burger is $570.51, Farxiga $545.65, Jardiance $552.43. Prices will decrease considerably after deductible is met, but the deductible resets in January. -Can evaluate for patient assistance tomorrow.   3) Education: - Patient has been educated on current HF medications and potential additions to HF medication regimen - Patient verbalizes understanding that over the next few months, these medication doses may change and more medications may be added to optimize HF regimen - Patient has been educated on basic disease state pathophysiology and goals of therapy  Medication Assistance / Insurance Benefits Check: Does the patient have prescription insurance?    Type of insurance plan:  Does the patient qualify for medication assistance through manufacturers or grants? Pending  Outpatient Pharmacy: Prior to admission outpatient pharmacy: [facility] mail order     Please do not hesitate to reach out with questions or concerns,  Enos Fling, PharmD, CPP, BCPS Heart Failure Pharmacist  Phone - [phone] 08/03/2023 10:42 AM

## 2023-08-03 NOTE — Progress Notes (Addendum)
Pt has been confused and restless all night. Moving from chair to bed then back to the chair with unsteady gait. PRN Trazodone given but ineffective. Pt is redirectable. Aware that he is going to have a cardiac cath this morning. Denies any pain or difficulty of breathing. NP Geradine Girt made aware via secure chat. Came and saw the pt, helped pt get back in bed.

## 2023-08-04 ENCOUNTER — Encounter
Admission: EM | Disposition: A | Discharge status: Home / Self Care | Payer: Self-pay | Source: Home / Self Care | Attending: Internal Medicine

## 2023-08-04 ENCOUNTER — Other Ambulatory Visit (HOSPITAL_COMMUNITY): Payer: Self-pay

## 2023-08-04 DIAGNOSIS — J9601 Acute respiratory failure with hypoxia: Secondary | ICD-10-CM | POA: Diagnosis not present

## 2023-08-04 DIAGNOSIS — J441 Chronic obstructive pulmonary disease with (acute) exacerbation: Secondary | ICD-10-CM | POA: Diagnosis not present

## 2023-08-04 DIAGNOSIS — I5021 Acute systolic (congestive) heart failure: Secondary | ICD-10-CM | POA: Diagnosis not present

## 2023-08-04 DIAGNOSIS — I214 Non-ST elevation (NSTEMI) myocardial infarction: Secondary | ICD-10-CM | POA: Diagnosis not present

## 2023-08-04 HISTORY — PX: LEFT HEART CATH AND CORONARY ANGIOGRAPHY: CATH118249

## 2023-08-04 LAB — BASIC METABOLIC PANEL
Anion gap: 11 (ref 5–15)
BUN: 35 mg/dL — ABNORMAL HIGH (ref 8–23)
CO2: 23 mmol/L (ref 22–32)
Calcium: 8.8 mg/dL — ABNORMAL LOW (ref 8.9–10.3)
Chloride: 102 mmol/L (ref 98–111)
Creatinine, Ser: 1.55 mg/dL — ABNORMAL HIGH (ref 0.61–1.24)
GFR, Estimated: 47 mL/min — ABNORMAL LOW (ref >60)
Glucose, Bld: 115 mg/dL — ABNORMAL HIGH (ref 70–99)
Potassium: 3.5 mmol/L (ref 3.5–5.1)
Sodium: 136 mmol/L (ref 135–145)

## 2023-08-04 LAB — CBC
HCT: 44.1 % (ref 39.0–52.0)
Hemoglobin: 15.1 g/dL (ref 13.0–17.0)
MCH: 29.8 pg (ref 26.0–34.0)
MCHC: 34.2 g/dL (ref 30.0–36.0)
MCV: 87.2 fL (ref 80.0–100.0)
Platelets: 158 10*3/uL (ref 150–400)
RBC: 5.06 MIL/uL (ref 4.22–5.81)
RDW: 14.3 % (ref 11.5–15.5)
WBC: 11.5 10*3/uL — ABNORMAL HIGH (ref 4.0–10.5)
nRBC: 0.2 % (ref 0.0–0.2)

## 2023-08-04 LAB — HEPARIN LEVEL (UNFRACTIONATED): Heparin Unfractionated: 0.47 [IU]/mL (ref 0.30–0.70)

## 2023-08-04 LAB — GLUCOSE, CAPILLARY
Glucose-Capillary: 116 mg/dL — ABNORMAL HIGH (ref 70–99)
Glucose-Capillary: 135 mg/dL — ABNORMAL HIGH (ref 70–99)
Glucose-Capillary: 158 mg/dL — ABNORMAL HIGH (ref 70–99)
Glucose-Capillary: 127 mg/dL — ABNORMAL HIGH (ref 70–99)

## 2023-08-04 SURGERY — LEFT HEART CATH AND CORONARY ANGIOGRAPHY
Anesthesia: Moderate Sedation

## 2023-08-04 MED ORDER — FUROSEMIDE 10 MG/ML IJ SOLN
INTRAMUSCULAR | Status: AC
  Filled 2023-08-04: qty 4

## 2023-08-04 MED ORDER — CARVEDILOL 25 MG PO TABS
25.0000 mg | ORAL_TABLET | Freq: Two times a day (BID) | ORAL | Status: DC
  Administered 2023-08-04 – 2023-08-06 (×4): 25 mg via ORAL
  Filled 2023-08-04 (×5): qty 1

## 2023-08-04 MED ORDER — LOSARTAN POTASSIUM 50 MG PO TABS
50.0000 mg | ORAL_TABLET | Freq: Two times a day (BID) | ORAL | Status: DC
  Administered 2023-08-04 – 2023-08-06 (×4): 50 mg via ORAL
  Filled 2023-08-04 (×5): qty 1

## 2023-08-04 MED ORDER — HEPARIN SODIUM (PORCINE) 1000 UNIT/ML IJ SOLN
INTRAMUSCULAR | Status: AC
  Filled 2023-08-04: qty 10

## 2023-08-04 MED ORDER — HEPARIN SODIUM (PORCINE) 1000 UNIT/ML IJ SOLN
INTRAMUSCULAR | Status: DC | PRN
  Administered 2023-08-04: 3500 [IU] via INTRAVENOUS

## 2023-08-04 MED ORDER — HEPARIN (PORCINE) IN NACL 1000-0.9 UT/500ML-% IV SOLN
INTRAVENOUS | Status: DC | PRN
  Administered 2023-08-04 (×2): 500 mL

## 2023-08-04 MED ORDER — VERAPAMIL HCL 2.5 MG/ML IV SOLN
INTRAVENOUS | Status: DC | PRN
  Administered 2023-08-04: 2.5 mg via INTRA_ARTERIAL

## 2023-08-04 MED ORDER — FUROSEMIDE 20 MG PO TABS: 20.0000 mg | ORAL_TABLET | Freq: Every day | ORAL | Status: DC

## 2023-08-04 MED ORDER — HYDRALAZINE HCL 20 MG/ML IJ SOLN
INTRAMUSCULAR | Status: AC
  Administered 2023-08-04: 10 mg via INTRAVENOUS
  Filled 2023-08-04: qty 1

## 2023-08-04 MED ORDER — ASPIRIN 81 MG PO CHEW: 81.0000 mg | CHEWABLE_TABLET | Freq: Every day | ORAL | Status: DC

## 2023-08-04 MED ORDER — SODIUM CHLORIDE 0.9 % IV SOLN
INTRAVENOUS | Status: AC | PRN
  Administered 2023-08-04: 10 mL/h via INTRAVENOUS

## 2023-08-04 MED ORDER — HYDRALAZINE HCL 20 MG/ML IJ SOLN: 10.0000 mg | INTRAMUSCULAR | Status: DC | PRN

## 2023-08-04 MED ORDER — CLOPIDOGREL BISULFATE 75 MG PO TABS
75.0000 mg | ORAL_TABLET | Freq: Every day | ORAL | Status: DC
  Administered 2023-08-05 – 2023-08-06 (×2): 75 mg via ORAL
  Filled 2023-08-04 (×2): qty 1

## 2023-08-04 MED ORDER — MIDAZOLAM HCL 2 MG/2ML IJ SOLN
INTRAMUSCULAR | Status: AC
  Filled 2023-08-04: qty 2

## 2023-08-04 MED ORDER — FUROSEMIDE 10 MG/ML IJ SOLN
INTRAMUSCULAR | Status: DC | PRN
  Administered 2023-08-04: 40 mg via INTRAVENOUS

## 2023-08-04 MED ORDER — VERAPAMIL HCL 2.5 MG/ML IV SOLN
INTRAVENOUS | Status: AC
Start: 2023-08-04 — End: ?
  Filled 2023-08-04: qty 2

## 2023-08-04 MED ORDER — IOHEXOL 300 MG/ML  SOLN
INTRAMUSCULAR | Status: DC | PRN
  Administered 2023-08-04: 100 mL

## 2023-08-04 MED ORDER — HEPARIN (PORCINE) IN NACL 1000-0.9 UT/500ML-% IV SOLN
INTRAVENOUS | Status: AC
  Filled 2023-08-04: qty 1000

## 2023-08-04 MED ORDER — APIXABAN 5 MG PO TABS
5.0000 mg | ORAL_TABLET | Freq: Two times a day (BID) | ORAL | Status: DC
  Administered 2023-08-05 – 2023-08-06 (×3): 5 mg via ORAL
  Filled 2023-08-04 (×4): qty 1

## 2023-08-04 MED ORDER — FENTANYL CITRATE (PF) 100 MCG/2ML IJ SOLN
INTRAMUSCULAR | Status: AC
  Filled 2023-08-04: qty 2

## 2023-08-04 MED ORDER — SODIUM CHLORIDE 0.9 % IV SOLN: 250.0000 mL | INTRAVENOUS | Status: AC | PRN

## 2023-08-04 MED ORDER — FUROSEMIDE 10 MG/ML IJ SOLN
40.0000 mg | Freq: Two times a day (BID) | INTRAMUSCULAR | Status: DC
  Administered 2023-08-05: 40 mg via INTRAVENOUS
  Filled 2023-08-04: qty 4

## 2023-08-04 MED ORDER — HALOPERIDOL LACTATE 5 MG/ML IJ SOLN
1.0000 mg | Freq: Once | INTRAMUSCULAR | Status: AC
  Administered 2023-08-04: 1 mg via INTRAVENOUS
  Filled 2023-08-04: qty 1

## 2023-08-04 SURGICAL SUPPLY — 10 items
CATH 5FR JL3.5 JR4 ANG PIG MP (CATHETERS) IMPLANT
DEVICE RAD TR BAND REGULAR (VASCULAR PRODUCTS) IMPLANT
DRAPE BRACHIAL (DRAPES) IMPLANT
GLIDESHEATH SLEND SS 6F .021 (SHEATH) IMPLANT
GUIDEWIRE INQWIRE 1.5J.035X260 (WIRE) IMPLANT
INQWIRE 1.5J .035X260CM (WIRE) ×1
PACK CARDIAC CATH (CUSTOM PROCEDURE TRAY) ×1 IMPLANT
PROTECTION STATION PRESSURIZED (MISCELLANEOUS) ×1
SET ATX-X65L (MISCELLANEOUS) IMPLANT
STATION PROTECTION PRESSURIZED (MISCELLANEOUS) IMPLANT

## 2023-08-04 NOTE — CV Procedure (Signed)
Brief inpatient cardiac cath  Indication non-STEMI Ischemic cardiomyopathy  Right radial approach  Left ventriculogram Severely depressed left ventricular function anterior apical hypokinesis apical thrombus EF less than 20% left ventricular enlargement  Coronary artery disease Left main LAD ostial LAD, 90% proximal,100% CTO Circumflex large ostial 75% minor irregularities RCA large ectasia minor irregularities 90% proximal PL 100% proximal PDA Right dominant system  Poor surgical candidate at this current state  Acute systolic congestive heart failure Recommend aggressive heart failure therapy Anticoagulation for apical thrombus  Hopefully with aggressive medical therapy improvement in overall condition the patient can then be considered for possible coronary bypass surgery  Recommend aspirin Plavix and Eliquis for 2 weeks Discontinue aspirin after 2 weeks and continue dual therapy

## 2023-08-04 NOTE — Progress Notes (Signed)
ANTICOAGULATION CONSULT NOTE  Pharmacy Consult for heparin infusion Indication: ACS/STEMI  No Known Allergies  Patient Measurements: Height: 4\' 11"  (149.9 cm) Weight: 77.7 kg (171 lb 3.2 oz) IBW/kg (Calculated) : 47.7 Heparin Dosing Weight: 65.7 kg  Vital Signs: Temp: 98 F (36.7 C) (11/05 0507) Temp Source: Oral (11/04 1943) BP: 98/74 (11/05 0507) Pulse Rate: 57 (11/05 0507)  Labs: Recent Labs    08/02/23 0536 08/03/23 0442 08/04/23 0517  HGB 15.0 14.2 15.1  HCT 44.5 41.1 44.1  PLT 179 158 158  HEPARINUNFRC 0.42 0.61 0.47  CREATININE 1.54* 1.44* 1.55*    Estimated Creatinine Clearance: 36.4 mL/min (A) (by C-G formula based on SCr of 1.55 mg/dL (H)).   Medical History: Past Medical History:  Diagnosis Date   Hypertension     Assessment: Pt is a 72 yo male w/ PMH of HTN, COPD presenting to ED c/o SOB & cough x 1 week, found with elevated BNP & Troponin I level.  Goal of Therapy:  Heparin level 0.3-0.7 units/ml Monitor platelets by anticoagulation protocol: Yes   1031 2208 HL 0.37, therapeutic x 1 1101 0511 HL 0.43, therapeutic x 2 1102 0258 HL 0.42, therapeutic x 3 1103 0536 HL 0.42, therapeutic x 4 1104 0442 HL 0.61, therapeutic x 5 1105 0517 HL 0.47, therapeutic x 6  Plan: heparin level therapeutic  ---continue heparin infusion rate at 1000 units/hr ---Will check HL daily w/ AM while therapeutic ---CBC once daily while on heparin  Abigayl Hor D 08/04/2023 6:02 AM

## 2023-08-04 NOTE — Progress Notes (Signed)
Progress Note   Patient: Larry [name] WJX:[id number] DOB: 1950-12-17 DOA: 07/29/2023     5 DOS: the patient was seen and examined on 08/04/2023   Brief hospital course: Taken from H&P.  [name] is a 72 y.o. Caucasian male with medical history significant for essential hypertension, and COPD who presented to the emergency room with acute onset of worsening dyspnea with associated cough productive of clear sputum over the last couple weeks with no fever or chills. He admits to orthopnea and paroxysmal nocturnal dyspnea as well as dyspnea on exertion. He denied any worsening lower extremity edema.   On presentation he was found to have elevated blood pressure at 175/127, tachycardic at 138, tachypneic and hypoxic requiring BiPAP. ABG with pH of 7.44 and pCO2 of 30 with pO2 of 81 and O2 sat of 97.4% with bicarbonate of 20.  CBC showed mild hypokalemia of 3.4 and blood glucose of 189 with CO2 of 21 and AST 46 total protein 6.2 total bili 1.3 and BUN and creatinine were 16 and 1.44 above previous levels in 2020.  I sensitive troponin I was 286 and later 481 with a BNP of 1281.2.  CBC was within normal.  INR is 1.2 and PT 15.4 with PTT of 25.  Respiratory panel came back negative. EKG with sinus tachycardia and premature supraventricular complexes with RBBB and Q waves in septal leads, T wave inversion inferiorly and laterally. CXR with patchy airspace opacity may be due to atelectasis or pneumonia with suspected small bilateral pleural effusions.  The patient was given 40 mg of IV Lasix 125 mg of IV Solu-Medrol 2 g of IV magnesium sulfate and DuoNeb. He was later started on IV heparin with increasing troponin I.   10/31: Vital stable, able to wean off from BiPAP, currently on 4 L of oxygen with no baseline oxygen use.  Renal function seems to be at baseline of 1.4-1.5.  Slowly rising troponin currently at 793. Echocardiogram with a EF of 30 to 35%, global hypokinesis and mildly dilated LV  cavity.  Patient had normal EF on an echo done last year.  Cardiology is on board and likely will need ischemic workup.  Patient apparently stopped taking all of his medications at home for concern of long-term side effects.  11/1: Vital stable, some improvement in creatinine to 1.4, slight worsening of leukocytosis but patient is on steroid.  Troponin continued to rise at 2627, new diagnosis of HFrEF likely patient has NSTEMI.  Still significant orthopnea, cardiology would like to diurese more before taking him to Cath Lab.  11/2: Patient with some improved orthopnea, increasing creatinine so decreased Lasix to daily, cardiac cath on Monday.  11/3: Vital stable, creatinine with some improvement to 1.54 today.  Cath tomorrow.  11/4: Vital stable, orthopnea improved.  Cardiac cath which was originally planned for today got canceled for tomorrow.  11/5: Vital remains stable, Lasix was switched with p.o., remained on heparin infusion and going for cardiac catheterization later today.  Assessment and Plan: * Acute respiratory failure with hypoxia (HCC) Initially requiring BiPAP,  no baseline oxygen use.  Now on room air.  Likely due to acute heart failure, with concern of NSTEMI.  There was also some concern of COPD exacerbation on admission. -Continue supplemental oxygen as needed  Non-STEMI (non-ST elevated myocardial infarction) (HCC) Slowly worsening troponin, EKG changes and echocardiogram with new onset of HFrEF. -Continue with heparin infusion -Cardiology is on board-cardiac cath later today -Continue with beta-blocker and  statin   Acute HFrEF (heart failure with reduced ejection fraction) (HCC) Echocardiogram today with EF of 30 to 35%, grade 1 diastolic dysfunction and global hypokinesis with mildly dilated LV.  Orthopnea improved Prior echocardiogram last year was normal. -IV Lasix has been switched with p.o. now -Daily weight and BMP -Strict intake and output  COPD  exacerbation (HCC) No wheezing today.  Improving oxygen requirement. -Completed a course of steroid -Continue with supportive care  Chronic kidney disease, stage 3a (HCC) Creatinine seems to be around baseline of 1.4-1.5.  And currently stable -Monitor renal function while patient is being diuresed -Avoid nephrotoxins  Peripheral neuropathy - We will need Neurontin.  Dyslipidemia - We will continue statin therapy. - Fasting lipids will be obtained.   Subjective: Patient was resting comfortably when seen today.  Denies any shortness of breath.  No agitation today.  Awaiting cardiac cath.  Sister at bedside  Physical Exam: Vitals:   08/03/23 2356 08/04/23 0507 08/04/23 1153 08/04/23 1322  BP: (!) 137/100 98/74 (!) 163/108 (!) 158/110  Pulse: 72 (!) 57 77 80  Resp: 17 17  19   Temp: 98.1 F (36.7 C) 98 F (36.7 C) 98.8 F (37.1 C) 98.1 F (36.7 C)  TempSrc:      SpO2: 95% 96% 96% 95%  Weight:      Height:       General.  Obese elderly man, in no acute distress. Pulmonary.  Lungs clear bilaterally, normal respiratory effort. CV.  Regular rate and rhythm, no JVD, rub or murmur. Abdomen.  Soft, nontender, nondistended, BS positive. CNS.  Alert and oriented .  No focal neurologic deficit. Extremities.  No edema, no cyanosis, pulses intact and symmetrical. Psychiatry.  Judgment and insight appears normal.   Data Reviewed: Prior data reviewed  Family Communication: Discussed with sister at bedside  Disposition: Status is: Inpatient Remains inpatient appropriate because: Severity of illness  Planned Discharge Destination: Home  DVT prophylaxis.  Heparin infusion Time spent: 45 minutes  This record has been created using Conservation officer, historic buildings. Errors have been sought and corrected,but may not always be located. Such creation errors do not reflect on the standard of care.   Author: Arnetha Courser, MD 08/04/2023 2:43 PM  For on call review [email or web address].

## 2023-08-04 NOTE — Progress Notes (Addendum)
[facility] CLINIC CARDIOLOGY CONSULT NOTE       Patient ID: OSIRIS ODRISCOLL MRN: [id number] DOB/AGE: [birth year] 72 y.o.  Admit date: 07/29/2023 Referring Physician Valente David, MD Primary Physician Rayetta Humphrey, MD Primary Cardiologist Dorothyann Peng, MD Reason for Consultation NSTEMI  HPI: DAVIAN HANSHAW III is a 72 y.o. male  with a past medical history of hyperlipidemia, hypertension, type 2 diabetes mellitus, stroke, neuropathy, RLS, abdominal hernia, GERD who presented to the ED on 07/29/2023 for shortness of breath and cough for 2 weeks. Cardiology was consulted for further evaluation.   Interval history: -Mental status improved this AM, A&O x3. States he did not sleep well last night.  -Denies any chest pain, SOB, palpitations.  -BP remains borderline, HR controlled.   Review of systems complete and found to be negative unless listed above    Past Medical History:  Diagnosis Date   Hypertension     History reviewed. No pertinent surgical history.  Medications Prior to Admission  Medication Sig Dispense Refill Last Dose   albuterol (VENTOLIN HFA) 108 (90 Base) MCG/ACT inhaler Inhale 2 puffs into the lungs every 4 (four) hours as needed for wheezing or shortness of breath.   prn at unknown   famotidine (PEPCID) 20 MG tablet Take 20 mg by mouth 2 (two) times daily.   07/29/2023   rosuvastatin (CRESTOR) 40 MG tablet Take 40 mg by mouth daily.   07/29/2023   allopurinol (ZYLOPRIM) 100 MG tablet Take 200 mg by mouth daily. (Patient not taking: Reported on 07/30/2023)   Not Taking   amLODipine (NORVASC) 10 MG tablet Take 10 mg by mouth daily. (Patient not taking: Reported on 07/30/2023)   Not Taking   gabapentin (NEURONTIN) 300 MG capsule Take 300 mg by mouth 3 (three) times daily. (Patient not taking: Reported on 07/30/2023)   Not Taking   hydrALAZINE (APRESOLINE) 100 MG tablet Take 100 mg by mouth 2 (two) times daily. (Patient not taking: Reported on 07/30/2023)   Not  Taking   indomethacin (INDOCIN) 25 MG capsule Take 25 mg by mouth 2 (two) times daily with a meal. (Patient not taking: Reported on 07/30/2023)   Not Taking   lisinopril (ZESTRIL) 40 MG tablet Take 40 mg by mouth daily. (Patient not taking: Reported on 07/30/2023)   Not Taking   lisinopril-hydrochlorothiazide (ZESTORETIC) 20-25 MG tablet Take 1 tablet by mouth daily. (Patient not taking: Reported on 07/30/2023)   Not Taking   metFORMIN (GLUCOPHAGE-XR) 750 MG 24 hr tablet Take 750 mg by mouth 2 (two) times daily. (Patient not taking: Reported on 07/30/2023)   Not Taking   metoprolol tartrate (LOPRESSOR) 100 MG tablet Take 100 mg by mouth 2 (two) times daily. (Patient not taking: Reported on 07/30/2023)   Not Taking   montelukast (SINGULAIR) 10 MG tablet Take 10 mg by mouth daily. (Patient not taking: Reported on 07/30/2023)   Not Taking   potassium chloride (K-DUR) 10 MEQ tablet Take 10 mEq by mouth daily. (Patient not taking: Reported on 07/30/2023)   Not Taking   primidone (MYSOLINE) 50 MG tablet Take by mouth 4 (four) times daily. (Patient not taking: Reported on 07/30/2023)   Not Taking   Social History   Socioeconomic History   Marital status: Married    Spouse name: Not on file   Number of children: Not on file   Years of education: Not on file   Highest education level: Not on file  Occupational History   Not on file  Tobacco Use   Smoking status: Never   Smokeless tobacco: Current  Substance and Sexual Activity   Alcohol use: Yes   Drug use: Never   Sexual activity: Not on file  Other Topics Concern   Not on file  Social History Narrative   Not on file   Social Determinants of Health   Financial Resource Strain: Medium Risk (01/20/2023)   Received from [facility]   Overall Financial Resource Strain (CARDIA)    Difficulty of Paying Living Expenses: Somewhat hard  Food Insecurity: No Food Insecurity (07/31/2023)   Hunger Vital Sign    Worried About  Running Out of Food in the Last Year: Never true    Ran Out of Food in the Last Year: Never true  Transportation Needs: No Transportation Needs (07/31/2023)   PRAPARE - Administrator, Civil Service (Medical): No    Lack of Transportation (Non-Medical): No  Physical Activity: Not on file  Stress: Not on file  Social Connections: Not on file  Intimate Partner Violence: Not At Risk (07/31/2023)   Humiliation, Afraid, Rape, and Kick questionnaire    Fear of Current or Ex-Partner: No    Emotionally Abused: No    Physically Abused: No    Sexually Abused: No    History reviewed. No pertinent family history.   Vitals:   08/03/23 1647 08/03/23 1943 08/03/23 2356 08/04/23 0507  BP: (!) 143/91 (!) 147/95 (!) 137/100 98/74  Pulse: 75 80 72 (!) 57  Resp:  19 17 17   Temp: (!) 97.5 F (36.4 C) 98 F (36.7 C) 98.1 F (36.7 C) 98 F (36.7 C)  TempSrc:  Oral    SpO2: 91% 93% 95% 96%  Weight:      Height:        PHYSICAL EXAM General: Elderly appearing male, well nourished, in no acute distress resting comfortably in hospital bed at slight incline with sister present at bedside.  HEENT: Normocephalic and atraumatic. Neck: No JVD.   Lungs: Normal respiratory effort on room air. Clear bilaterally to auscultation. No wheezes. Mild crackles bilaterally. Heart: HRRR. Normal S1 and S2 without gallops or murmurs. Mild bibasilar crackles, improved overall.  Abdomen: Non-distended appearing.  Msk: Normal strength and tone for age. Extremities: Warm and well perfused. No clubbing, cyanosis. No edema.  Neuro: Alert and oriented X 3. Psych: Answers questions appropriately.   Labs: Basic Metabolic Panel: Recent Labs    08/03/23 0442 08/04/23 0517  NA 135 136  K 4.0 3.5  CL 102 102  CO2 23 23  GLUCOSE 191* 115*  BUN 41* 35*  CREATININE 1.44* 1.55*  CALCIUM 8.5* 8.8*   Liver Function Tests: No results for input(s): "AST", "ALT", "ALKPHOS", "BILITOT", "PROT", "ALBUMIN" in the  last 72 hours.  No results for input(s): "LIPASE", "AMYLASE" in the last 72 hours. CBC: Recent Labs    08/03/23 0442 08/04/23 0517  WBC 12.3* 11.5*  HGB 14.2 15.1  HCT 41.1 44.1  MCV 88.0 87.2  PLT 158 158   Cardiac Enzymes: No results for input(s): "CKTOTAL", "CKMB", "CKMBINDEX", "TROPONINIHS" in the last 72 hours.  BNP: No results for input(s): "BNP" in the last 72 hours.  D-Dimer: No results for input(s): "DDIMER" in the last 72 hours. Hemoglobin A1C: No results for input(s): "HGBA1C" in the last 72 hours. Fasting Lipid Panel: No results for input(s): "CHOL", "HDL", "LDLCALC", "TRIG", "CHOLHDL", "LDLDIRECT" in the last 72 hours. Thyroid Function Tests: No results for input(s): "TSH", "T4TOTAL", "T3FREE", "  THYROIDAB" in the last 72 hours.  Invalid input(s): "FREET3" Anemia Panel: No results for input(s): "VITAMINB12", "FOLATE", "FERRITIN", "TIBC", "IRON", "RETICCTPCT" in the last 72 hours.   Radiology: ECHOCARDIOGRAM COMPLETE  Result Date: 07/30/2023    ECHOCARDIOGRAM REPORT   Patient Name:   CHIBUIKEM THANG III Date of Exam: 07/30/2023 Medical Rec #:  [id number]          Height:       59.0 in Accession #:    [id number]         Weight:       176.0 lb Date of Birth:  1951-02-08          BSA:          1.747 m Patient Age:    72 years           BP:           124/102 mmHg Patient Gender: M                  HR:           118 bpm. Exam Location:  [facility] Procedure: 2D Echo, Cardiac Doppler, Color Doppler and Intracardiac            Opacification Agent Indications:     CHF  History:         Patient has no prior history of Echocardiogram examinations.                  CHF, Acute MI, COPD; Risk Factors:Dyslipidemia.  Sonographer:     Mikki Harbor Referring Phys:  [id number] JAN A MANSY Diagnosing Phys: Julien Nordmann MD  Sonographer Comments: Image acquisition challenging due to COPD. IMPRESSIONS  1. Left ventricular ejection fraction, by estimation, is 30 to 35%. Left ventricular ejection  fraction by PLAX is 32 %. The left ventricle has moderately decreased function. The left ventricle demonstrates global hypokinesis. The left ventricular internal cavity size was mildly dilated. Left ventricular diastolic parameters are indeterminate.  2. Right ventricular systolic function is normal. The right ventricular size is normal. Tricuspid regurgitation signal is inadequate for assessing PA pressure. The estimated right ventricular systolic pressure is 17.4 mmHg.  3. The mitral valve is normal in structure. Mild to moderate mitral valve regurgitation. No evidence of mitral stenosis.  4. The aortic valve is normal in structure. Aortic valve regurgitation is mild. No aortic stenosis is present.  5. The inferior vena cava is normal in size with greater than 50% respiratory variability, suggesting right atrial pressure of 3 mmHg. FINDINGS  Left Ventricle: Left ventricular ejection fraction, by estimation, is 30 to 35%. Left ventricular ejection fraction by PLAX is 32 %. The left ventricle has moderately decreased function. The left ventricle demonstrates global hypokinesis. Definity contrast agent was given IV to delineate the left ventricular endocardial borders. The left ventricular internal cavity size was mildly dilated. There is no left ventricular hypertrophy. Left ventricular diastolic parameters are indeterminate. Right Ventricle: The right ventricular size is normal. No increase in right ventricular wall thickness. Right ventricular systolic function is normal. Tricuspid regurgitation signal is inadequate for assessing PA pressure. The tricuspid regurgitant velocity is 1.90 m/s, and with an assumed right atrial pressure of 3 mmHg, the estimated right ventricular systolic pressure is 17.4 mmHg. Left Atrium: Left atrial size was normal in size. Right Atrium: Right atrial size was normal in size. Pericardium: There is no evidence of pericardial effusion. Mitral Valve: The mitral valve is normal in  structure. Mild  to moderate mitral valve regurgitation. No evidence of mitral valve stenosis. MV peak gradient, 5.5 mmHg. The mean mitral valve gradient is 2.0 mmHg. Tricuspid Valve: The tricuspid valve is normal in structure. Tricuspid valve regurgitation is mild . No evidence of tricuspid stenosis. Aortic Valve: The aortic valve is normal in structure. Aortic valve regurgitation is mild. No aortic stenosis is present. Aortic valve mean gradient measures 3.0 mmHg. Aortic valve peak gradient measures 4.8 mmHg. Aortic valve area, by VTI measures 2.39 cm. Pulmonic Valve: The pulmonic valve was normal in structure. Pulmonic valve regurgitation is not visualized. No evidence of pulmonic stenosis. Aorta: The aortic root is normal in size and structure. Venous: The inferior vena cava is normal in size with greater than 50% respiratory variability, suggesting right atrial pressure of 3 mmHg. IAS/Shunts: No atrial level shunt detected by color flow Doppler.  LEFT VENTRICLE PLAX 2D LV EF:         Left            Diastology                ventricular     LV e' medial:    10.80 cm/s                ejection        LV E/e' medial:  9.1                fraction by     LV e' lateral:   9.14 cm/s                PLAX is 32      LV E/e' lateral: 10.8                %. LVIDd:         4.70 cm LVIDs:         4.00 cm LV PW:         1.30 cm LV IVS:        1.20 cm LVOT diam:     2.00 cm LV SV:         43 LV SV Index:   25 LVOT Area:     3.14 cm  LV Volumes (MOD) LV vol d, MOD    96.6 ml A4C: LV vol s, MOD    63.9 ml A4C: LV SV MOD A4C:   96.6 ml RIGHT VENTRICLE RV Basal diam:  3.20 cm RV Mid diam:    2.80 cm RV S prime:     12.60 cm/s LEFT ATRIUM             Index        RIGHT ATRIUM           Index LA diam:        3.80 cm 2.18 cm/m   RA Area:     12.80 cm LA Vol (A2C):   47.7 ml 27.31 ml/m  RA Volume:   33.40 ml  19.12 ml/m LA Vol (A4C):   53.4 ml 30.57 ml/m LA Biplane Vol: 51.4 ml 29.42 ml/m  AORTIC VALVE                     PULMONIC VALVE AV Area (Vmax):    2.46 cm     PV Vmax:       0.75 m/s AV Area (Vmean):   2.12 cm     PV Peak grad:  2.3 mmHg AV Area (VTI):  2.39 cm AV Vmax:           109.50 cm/s AV Vmean:          81.750 cm/s AV VTI:            0.180 m AV Peak Grad:      4.8 mmHg AV Mean Grad:      3.0 mmHg LVOT Vmax:         85.67 cm/s LVOT Vmean:        55.067 cm/s LVOT VTI:          0.136 m LVOT/AV VTI ratio: 0.76  AORTA Ao Root diam: 3.30 cm MITRAL VALVE               TRICUSPID VALVE MV Area (PHT): 7.32 cm    TR Peak grad:   14.4 mmHg MV Area VTI:   1.95 cm    TR Vmax:        190.00 cm/s MV Peak grad:  5.5 mmHg MV Mean grad:  2.0 mmHg    SHUNTS MV Vmax:       1.17 m/s    Systemic VTI:  0.14 m MV Vmean:      65.6 cm/s   Systemic Diam: 2.00 cm MV Decel Time: 104 msec MV E velocity: 98.38 cm/s MV A velocity: 84.35 cm/s MV E/A ratio:  1.17 Julien Nordmann MD Electronically signed by Julien Nordmann MD Signature Date/Time: 07/30/2023/1:36:14 PM    Final    DG Chest 1 View  Result Date: 07/30/2023 CLINICAL DATA:  Shortness of breath EXAM: CHEST  1 VIEW COMPARISON:  05/19/2014 FINDINGS: Low lung volumes accentuate pulmonary vascularity. Normal cardiomediastinal silhouette. Patchy bilateral airspace opacities. Retrocardiac atelectasis or consolidation. Suspected small bilateral pleural effusions. No pneumothorax. No displaced rib fractures. IMPRESSION: Patchy airspace opacities may be due to atelectasis or pneumonia. Suspected small bilateral pleural effusions. Electronically Signed   By: Minerva Fester M.D.   On: 07/30/2023 02:30    ECHO As above (EF 30-35%)  TELEMETRY reviewed by me 08/04/2023: sinus rhythm PACs rate 70s  EKG reviewed by me: Sinus tachy with PACs, RBBB, rate 133 bpm (non-acute)  Pertinent Cardiac History CT Coronary 11/24/2022: Aorta normal caliber without dissection. Atherosclerotic calcifications descending thoracic aorta. No pericardial effusion. Visualized pulmonary arteries  patent.  Stress Test 08/07/2022:  Abnormal myocardial perfusion scar large area of anterior apical inferior apical defect with significant reversibility consistent with ischemia overall left ventricular function is normal around 59% no clear wall motion abnormality study is consistent with ischemia this is a  high risk scan recommend further evaluation invasively. Regional wall motion reveals normal myocardial thickening and wall motion. LVEF 59%  Data reviewed by me 08/04/2023: last 24h vitals tele labs imaging I/O hospitalist progress note  Principal Problem:   Acute respiratory failure with hypoxia (HCC) Active Problems:   Non-STEMI (non-ST elevated myocardial infarction) (HCC)   Dyslipidemia   Peripheral neuropathy   COPD exacerbation (HCC)   Acute congestive heart failure (HCC)   Acute HFrEF (heart failure with reduced ejection fraction) (HCC)   Chronic kidney disease, stage 3a (HCC)    ASSESSMENT AND PLAN:  KASHIF POOLER III is a 72 y.o. male  with a past medical history of hyperlipidemia, hypertension, type 2 diabetes mellitus, stroke, neuropathy, RLS, abdominal hernia, GERD who presented to the ED on 07/29/2023 for shortness of breath and cough for 2 weeks. Cardiology was consulted for further evaluation.   # Acute respiratory failure with hypoxia  # Acute  HFrEF (EF 30-35%) # Hypertension Upon admission patient reports worsening shortness of breath for past 2 weeks. Patient states he can't walk without getting SOB and this is new for him. BNP 1,281. Patient was placed on 4L [place] and not on O2 at baseline, did require BiPAP initially in the ED. Echo this admission with newly reduced EF to 30-35%. Net negative 2.8 L since admission.  -Transition to po Lasix today.  -Continue metoprolol succinate 100 mg daily.  -Continue lisinopril 10 mg daily.   #NSTEMI #Hyperlipidemia Patient reporting worsening DOE for 2 weeks. Denies chest pain or pressure. Upon admission patient has been  tachycardiac with blood pressure slightly elevated. Troponin 286 > 481 > 793. Patient received 324 mg ASA. EKG showed sinus tachy with PACs, RBBB, rate 133 bpm, which is non-acute. Most recent lipid panel 06/2023 with TC 109 and LDL 46. -Continue heparin drip. -Continue rosuvastatin 40 mg  -Discussed the risks and benefits of proceeding with LHC for further evaluation with the patient.  He is agreeable to proceed.  NPO until LHC this afternoon (11/5) with Dr. Juliann Pares.  Written consent will be obtained.  Further recommendations following LHC.    #Chronic kidney disease 3a Cr 1.44 on presentation, variable throughout admission. Up to 1.64 on 11/2 now downtrending to 1.55 on AM labs. Baseline around 1.4-1.5.  -Monitor renal function closely with diureses.  -Monitor electrolytes, strict I's & O's, daily weights      This patient's plan of care was discussed and created with Dr. Melton Alar and she is in agreement.  Signed: Gale Journey, PA-C  08/04/2023, 10:00 AM [facility] Cardiology

## 2023-08-04 NOTE — Progress Notes (Signed)
Heart Failure Stewardship Pharmacy Note  PCP: Rayetta Humphrey, MD PCP-Cardiologist: None  HPI: Larry [name] is a 72 y.o. male with hypertension, dyslipidemia, CAD, CVA and COPD who presented with worsening of exertional dyspnea over the past 2 weeks. Arrived to the emergency department with tachycardia, hypertension, and hypoxia requiring BIPAP.  Troponin 2148 on admission, now 2627. BNP on admission significantly elevated to 1281.2. TTE this admission showed LVEF of 30-35%, mild-moderate MR. Cardiac cath planned for today.  Pertinent cardiac history: Stress test and TTE in 05/2014 with normal LVEF and no evidence of ischemia. Stress test 07/2022 showed perfusion scar area of anterior apical inferior defect consistent with ischemia and LVEF was 59%. TTE 07/2022 showed normal LVEF.   Pertinent Lab Values: Creatinine  Date Value Ref Range Status  05/19/2014 1.78 (H) 0.60 - 1.30 mg/dL Final   Creatinine, Ser  Date Value Ref Range Status  08/04/2023 1.55 (H) 0.61 - 1.24 mg/dL Final   BUN  Date Value Ref Range Status  08/04/2023 35 (H) 8 - 23 mg/dL Final  16/06/9603 19 (H) 7 - 18 mg/dL Final   Potassium  Date Value Ref Range Status  08/04/2023 3.5 3.5 - 5.1 mmol/L Final  05/19/2014 3.4 (L) 3.5 - 5.1 mmol/L Final   Sodium  Date Value Ref Range Status  08/04/2023 136 135 - 145 mmol/L Final  05/19/2014 141 136 - 145 mmol/L Final   B Natriuretic Peptide  Date Value Ref Range Status  07/30/2023 1,281.2 (H) 0.0 - 100.0 pg/mL Final    Comment:    Performed at [facility], [address] [place], [place] [address]   Magnesium  Date Value Ref Range Status  07/30/2023 2.0 1.7 - 2.4 mg/dL Final    Comment:    Performed at [facility], [address] [place], [place] [address]   Hgb A1c MFr Bld  Date Value Ref Range Status  07/30/2023 6.2 (H) 4.8 - 5.6 % Final    Comment:    (NOTE) Pre diabetes:          5.7%-6.4%  Diabetes:               >6.4%  Glycemic control for   <7.0% adults with diabetes     Vital Signs: Admission weight: 176 lbs Temp:  [97.5 F (36.4 C)-98.1 F (36.7 C)] 98 F (36.7 C) (11/05 0507) Pulse Rate:  [57-80] 57 (11/05 0507) Cardiac Rhythm: Normal sinus rhythm (11/05 0700) Resp:  [17-19] 17 (11/05 0507) BP: (98-147)/(74-100) 98/74 (11/05 0507) SpO2:  [91 %-96 %] 96 % (11/05 0507)  Intake/Output Summary (Last 24 hours) at 08/04/2023 1101 Last data filed at 08/04/2023 0531 Gross per 24 hour  Intake 225.14 ml  Output 500 ml  Net -274.86 ml    Current Heart Failure Medications:  Loop diuretic: furosemide 20 mg daily + potassium 10 meq daily Beta-Blocker: metoprolol succinate 100 mg daily ACEI/ARB/ARNI: lisinopril 10 mg daily MRA: none SGLT2i: none  Prior to admission Heart Failure Medications:  Loop diuretic: none Beta-Blocker: metoprolol tartrate 100 mg BID  ACEI/ARB/ARNI: lisinopril 10 mg daily MRA: none SGLT2i: none Other vasoactive medications include amlodipine 10 mg daily  Assessment: 1. 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 NYHA class IIIB symptoms.  -Symptoms: Reports shortness of breath has improved with diuresis. Experienced AMS the last several days, but this appears much improved after stopping primidone. No orthopnea. -  Volume: No significant lower extremity edema noted on exam. Creatinine improved with diuresis and today bumped along with BUN. Agree with transition to oral diuretics.  -Hemodynamics: BP was much improved after adding home medications, however patient had one low reading this AM.  -BB: Transitioned to metoprolol succinate 100 mg daily and tolerating well. -ACEI/ARB/ARNI: Continue lisinopril 10 mg daily for now. Suspect low BP could be erroneous, however will follow on recheck. If BP remains low, may require dose reduction. Would consider transition to [facility], however patient  has not met his $545 deductible and would have to pay it again in January. Can have a risk benefit discussion after cath. -MRA: Spironolactone can be added after cath to help maintain potassium >4, augment diuresis, and provide mortality reduction in HFrEF pending BP and creatinine. -SGLT2i: Can consider adding after cath if creatinine is stable to augment diuresis. Again, the high deductible may limit use.  Plan: 1) Medication changes recommended at this time: -Agree with transition to oral diuretics. Will f/u after heart catheterization.   2) Patient assistance: -Has not yet met $545 deductible. -Copay for Sherryll Burger is $570.51, Farxiga $545.65, Jardiance $552.43. Prices will decrease considerably after deductible is met, but the deductible resets in January. -Can evaluate for patient assistance tomorrow.   3) Education: - Patient has been educated on current HF medications and potential additions to HF medication regimen - Patient verbalizes understanding that over the next few months, these medication doses may change and more medications may be added to optimize HF regimen - Patient has been educated on basic disease state pathophysiology and goals of therapy  Medication Assistance / Insurance Benefits Check: Does the patient have prescription insurance?    Type of insurance plan:  Does the patient qualify for medication assistance through manufacturers or grants? Pending  Outpatient Pharmacy: Prior to admission outpatient pharmacy: [facility] order     Please do not hesitate to reach out with questions or concerns,  Enos Fling, PharmD, CPP, BCPS Heart Failure Pharmacist  Phone - [phone] 08/04/2023 11:01 AM

## 2023-08-04 NOTE — Care Management Important Message (Signed)
Important Message  Patient Details  Name: Larry [name] MRN: [id number] Date of Birth: [birth year]   Important Message Given:  Yes - Medicare IM     Olegario Messier A Mohammed Mcandrew 08/04/2023, 3:05 PM

## 2023-08-05 ENCOUNTER — Other Ambulatory Visit (HOSPITAL_COMMUNITY): Payer: Self-pay

## 2023-08-05 ENCOUNTER — Inpatient Hospital Stay (HOSPITAL_COMMUNITY)
Admit: 2023-08-05 | Discharge: 2023-08-05 | Disposition: A | Discharge status: Home / Self Care | Payer: Medicare HMO | Attending: Internal Medicine | Admitting: Internal Medicine

## 2023-08-05 DIAGNOSIS — I214 Non-ST elevation (NSTEMI) myocardial infarction: Secondary | ICD-10-CM | POA: Diagnosis not present

## 2023-08-05 DIAGNOSIS — I255 Ischemic cardiomyopathy: Secondary | ICD-10-CM | POA: Diagnosis not present

## 2023-08-05 DIAGNOSIS — I5021 Acute systolic (congestive) heart failure: Secondary | ICD-10-CM | POA: Diagnosis not present

## 2023-08-05 DIAGNOSIS — J9601 Acute respiratory failure with hypoxia: Secondary | ICD-10-CM | POA: Diagnosis not present

## 2023-08-05 LAB — CBC
HCT: 43.6 % (ref 39.0–52.0)
Hemoglobin: 15.1 g/dL (ref 13.0–17.0)
MCH: 29.8 pg (ref 26.0–34.0)
MCHC: 34.6 g/dL (ref 30.0–36.0)
MCV: 86.2 fL (ref 80.0–100.0)
Platelets: 152 10*3/uL (ref 150–400)
RBC: 5.06 MIL/uL (ref 4.22–5.81)
RDW: 14.4 % (ref 11.5–15.5)
WBC: 9 10*3/uL (ref 4.0–10.5)
nRBC: 0 % (ref 0.0–0.2)

## 2023-08-05 LAB — BASIC METABOLIC PANEL
Anion gap: 12 (ref 5–15)
BUN: 31 mg/dL — ABNORMAL HIGH (ref 8–23)
CO2: 23 mmol/L (ref 22–32)
Calcium: 8.7 mg/dL — ABNORMAL LOW (ref 8.9–10.3)
Chloride: 105 mmol/L (ref 98–111)
Creatinine, Ser: 1.55 mg/dL — ABNORMAL HIGH (ref 0.61–1.24)
GFR, Estimated: 47 mL/min — ABNORMAL LOW (ref >60)
Glucose, Bld: 121 mg/dL — ABNORMAL HIGH (ref 70–99)
Potassium: 3.6 mmol/L (ref 3.5–5.1)
Sodium: 140 mmol/L (ref 135–145)

## 2023-08-05 LAB — GLUCOSE, CAPILLARY
Glucose-Capillary: 128 mg/dL — ABNORMAL HIGH (ref 70–99)
Glucose-Capillary: 160 mg/dL — ABNORMAL HIGH (ref 70–99)
Glucose-Capillary: 172 mg/dL — ABNORMAL HIGH (ref 70–99)
Glucose-Capillary: 190 mg/dL — ABNORMAL HIGH (ref 70–99)

## 2023-08-05 LAB — ECHOCARDIOGRAM LIMITED
Est EF: <20
Height: 59 in
S' Lateral: 4.2 cm
Weight: 2786.61 [oz_av]

## 2023-08-05 MED ORDER — FUROSEMIDE 10 MG/ML IJ SOLN
40.0000 mg | Freq: Two times a day (BID) | INTRAMUSCULAR | Status: AC
  Administered 2023-08-05: 40 mg via INTRAVENOUS
  Filled 2023-08-05: qty 4

## 2023-08-05 MED ORDER — PERFLUTREN LIPID MICROSPHERE
1.0000 mL | INTRAVENOUS | Status: AC | PRN
  Administered 2023-08-05: 6 mL via INTRAVENOUS

## 2023-08-05 MED ORDER — SPIRONOLACTONE 25 MG PO TABS
25.0000 mg | ORAL_TABLET | Freq: Every day | ORAL | Status: DC
  Administered 2023-08-05 – 2023-08-06 (×2): 25 mg via ORAL
  Filled 2023-08-05 (×2): qty 1

## 2023-08-05 NOTE — Progress Notes (Signed)
Heart Failure Stewardship Pharmacy Note  PCP: Rayetta Humphrey, MD PCP-Cardiologist: None  HPI: Larry [name] is a 72 y.o. male with hypertension, dyslipidemia, CAD, CVA and COPD who presented with worsening of exertional dyspnea over the past 2 weeks. Arrived to the emergency department with tachycardia, hypertension, and hypoxia requiring BIPAP.  Troponin 2148 on admission, now 2627. BNP on admission significantly elevated to 1281.2. TTE this admission showed LVEF of 30-35%, mild-moderate MR. Cardiac cath showed severe multivessel disease and significantly elevated LVEDP of 44. LV-gram showed LVEF <20% and apical akinesis with thrombus, started on Eliquis. Noted to be a poor surgical candidate at this time  Pertinent cardiac history: Stress test and TTE in 05/2014 with normal LVEF and no evidence of ischemia. Stress test 07/2022 showed perfusion scar area of anterior apical inferior defect consistent with ischemia and LVEF was 59%. TTE 07/2022 showed normal LVEF.   Pertinent Lab Values: Creatinine  Date Value Ref Range Status  05/19/2014 1.78 (H) 0.60 - 1.30 mg/dL Final   Creatinine, Ser  Date Value Ref Range Status  08/05/2023 1.55 (H) 0.61 - 1.24 mg/dL Final   BUN  Date Value Ref Range Status  08/05/2023 31 (H) 8 - 23 mg/dL Final  32/44/0102 19 (H) 7 - 18 mg/dL Final   Potassium  Date Value Ref Range Status  08/05/2023 3.6 3.5 - 5.1 mmol/L Final  05/19/2014 3.4 (L) 3.5 - 5.1 mmol/L Final   Sodium  Date Value Ref Range Status  08/05/2023 140 135 - 145 mmol/L Final  05/19/2014 141 136 - 145 mmol/L Final   B Natriuretic Peptide  Date Value Ref Range Status  07/30/2023 1,281.2 (H) 0.0 - 100.0 pg/mL Final    Comment:    Performed at [facility], [address] [place], [place] [address]   Magnesium  Date Value Ref Range Status  07/30/2023 2.0 1.7 - 2.4 mg/dL Final    Comment:    Performed at [facility], [address] [place], [place]  [address]   Hgb A1c MFr Bld  Date Value Ref Range Status  07/30/2023 6.2 (H) 4.8 - 5.6 % Final    Comment:    (NOTE) Pre diabetes:          5.7%-6.4%  Diabetes:              >6.4%  Glycemic control for   <7.0% adults with diabetes    Vital Signs: Admission weight: 176 lbs Temp:  [97.6 F (36.4 C)-98.9 F (37.2 C)] 98.7 F (37.1 C) (11/06 0431) Pulse Rate:  [66-89] 81 (11/06 0431) Cardiac Rhythm: Normal sinus rhythm (11/06 0700) Resp:  [15-23] 20 (11/06 0431) BP: (128-166)/(80-121) 137/94 (11/06 0431) SpO2:  [89 %-96 %] 91 % (11/06 0431) Weight:  [79 kg (174 lb 2.6 oz)] 79 kg (174 lb 2.6 oz) (11/06 0500)  Intake/Output Summary (Last 24 hours) at 08/05/2023 1249 Last data filed at 08/04/2023 2105 Gross per 24 hour  Intake 448.72 ml  Output 2475 ml  Net -2026.28 ml    Current Heart Failure Medications:  Loop diuretic: furosemide 40 mg IV BID + potassium 10 meq daily Beta-Blocker: metoprolol succinate 100 mg daily ACEI/ARB/ARNI: losartan 50 mg BID MRA: none SGLT2i: none  Prior to admission Heart Failure Medications:  Loop diuretic: none Beta-Blocker: metoprolol tartrate 100 mg BID  ACEI/ARB/ARNI: lisinopril 10 mg daily MRA: none SGLT2i: none Other vasoactive medications include amlodipine 10 mg daily  Assessment: 1. 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 NYHA class IIIB symptoms.  -Symptoms: Reports shortness of breath has improved with diuresis. Experienced AMS that improved with stopping primidone. Suspect low output state also contributing to mental status not being entirely back to baseline. No orthopnea. -Volume: No significant lower extremity edema noted on exam. Creatinine improved with diuresis and remained stable after restarting IV diuretics. Given massively elevated LVEDP, will continue IV diuretics tonight. Can consider transition to oral diuretics tomorrow. -Hemodynamics: BP  was much improved after adding home medications, but has been high again over the last day.  -BB: metoprolol succinate was changed to carvedilol 25 mg BID. May require dose reduction if symptoms worsen given that beta blockade can further reduce cardiac output in patient's already with low cardiac output. May need to consider reduction if AMS worsens, urine output suddenly drops, or other low flow symptoms occur. -ACEI/ARB/ARNI: Continue losartan 50 mg twice daily for now. Can consolidate to once daily at discharge if stable on current dose. Would consider transition to [facility], however patient has not met his $545 deductible and would have to pay it again in January. Provided financial resource information and can consider adding outpatient. -MRA: Agree with adding spironolactone. Will monitor lytes as potassium supplement may require adjustment. -SGLT2i: Can consider adding with 30 day free card prior to discharge. Would need Medicare Extra Help or patient assistance to continue afterwards.   Plan: 1) Medication changes recommended at this time: -Agree with starting spironolactone. Monitor K.   2) Patient assistance: -Has not yet met $545 deductible. -Copay for Sherryll Burger is $570.51, Farxiga $545.65, Jardiance $552.43. Prices will decrease considerably after deductible is met, but the deductible resets in January. -Can evaluate for patient assistance tomorrow.   3) Education: - Patient has been educated on current HF medications and potential additions to HF medication regimen - Patient verbalizes understanding that over the next few months, these medication doses may change and more medications may be added to optimize HF regimen - Patient has been educated on basic disease state pathophysiology and goals of therapy  Medication Assistance / Insurance Benefits Check: Does the patient have prescription insurance?    Type of insurance plan:  Does the patient qualify for medication assistance  through manufacturers or grants? Pending  Outpatient Pharmacy: Prior to admission [facility] order     Please do not hesitate to reach out with questions or concerns,  Enos Fling, PharmD, CPP, BCPS Heart Failure Pharmacist  Phone - [phone] 08/05/2023 12:49 PM

## 2023-08-05 NOTE — TOC CM/SW Note (Signed)
Received call from [place] with [employer]. She received Life Vest order from [place], [place] with cardiology. CSW faxed requested clinicals to Irving Burton to add to the order. Per PA, potential discharge today. [employer] will complete vest fitting later today.  Charlynn Court, CSW [phone]

## 2023-08-05 NOTE — Evaluation (Signed)
Physical Therapy Evaluation Patient Details Name: Larry [name] MRN: [id number] DOB: 29-Apr-1951 Today's Date: 08/05/2023  History of Present Illness  Patient is a 72 year old male presenting with worsening dyspnea, acute respiratory failure with hypoxia, Non-STEMI, acute heart failure with reduces ejection fraction, COPD exacerbation. S/p cardiac cath  Clinical Impression  Patient is agreeable to PT evaluation. Supportive sister at the bedside. Patient is independent and lives alone prior to this hospital stay. He plans to go home to his daughter's house where he will have family assistance as needed.  The patient is modified independent with standing from recliner chair today. He walked with CGA- supervision into the hallway without assistive device. No shortness of breath noted, however patient is fatigued with activity. Sp02 93% on room air while walking. Encouraged energy conservation techniques as needed at home. No immediate PT needs anticipated after this hospital stay. PT will continue to follow while in the hospital to maximize independence.       If plan is discharge home, recommend the following: Assist for transportation;Help with stairs or ramp for entrance   Can travel by private vehicle        Equipment Recommendations None recommended by PT  Recommendations for Other Services       Functional Status Assessment Patient has had a recent decline in their functional status and demonstrates the ability to make significant improvements in function in a reasonable and predictable amount of time.     Precautions / Restrictions Precautions Precautions: Fall Restrictions Weight Bearing Restrictions: No      Mobility  Bed Mobility               General bed mobility comments: not assessed as patient sitting up on arrival and post session    Transfers Overall transfer level: Needs assistance Equipment used: None Transfers: Sit to/from Stand Sit to Stand:  Modified independent (Device/Increase time)                Ambulation/Gait Ambulation/Gait assistance: Supervision Gait Distance (Feet): 110 Feet Assistive device: None Gait Pattern/deviations: Step-through pattern, Decreased stride length Gait velocity: decreased     General Gait Details: patient ambulated in hallway with no loss of balance. no shortness of breath noted. patient does report feeling fatigued and "weak in the knees." Sp02 93% on room air with walking  Stairs            Wheelchair Mobility     Tilt Bed    Modified Rankin (Stroke Patients Only)       Balance Overall balance assessment: Needs assistance Sitting-balance support: Feet supported Sitting balance-Leahy Scale: Good     Standing balance support: No upper extremity supported Standing balance-Leahy Scale: Fair                               Pertinent Vitals/Pain Pain Assessment Pain Assessment: No/denies pain    Home Living Family/patient expects to be discharged to:: Private residence Living Arrangements: Alone Available Help at Discharge: Family Type of Home: House             Additional Comments: patient is planning to go to his daughter's home at discharge, single story with 2 steps to enter. daughter and granddaughter will be able to assist as needed with cooking, cleaning, driving, etc.    Prior Function Prior Level of Function : Independent/Modified Independent  Extremity/Trunk Assessment   Upper Extremity Assessment Upper Extremity Assessment: Overall WFL for tasks assessed    Lower Extremity Assessment Lower Extremity Assessment: Generalized weakness       Communication   Communication Communication: Difficulty following commands/understanding Following commands: Follows one step commands with increased time Cueing Techniques: Verbal cues  Cognition Arousal: Alert Behavior During Therapy: Flat affect Overall  Cognitive Status: Impaired/Different from baseline Area of Impairment: Following commands                       Following Commands: Follows one step commands with increased time       General Comments: chart indicates some confusion recently (currently has tele-sitter). patient is grossly oriented, cooperative, and able to follow single step commands with increased time        General Comments General comments (skin integrity, edema, etc.): supportive sister at the bedside (retired Engineer, civil (consulting)). educated patient on energy conservation strategies, routine short distance ambulation for endurance.    Exercises     Assessment/Plan    PT Assessment Patient needs continued PT services  PT Problem List Decreased activity tolerance;Decreased balance;Decreased mobility;Decreased cognition;Decreased strength       PT Treatment Interventions DME instruction;Gait training;Stair training;Functional mobility training;Therapeutic activities;Therapeutic exercise;Balance training;Neuromuscular re-education;Cognitive remediation;Patient/family education    PT Goals (Current goals can be found in the Care Plan section)  Acute Rehab PT Goals Patient Stated Goal: to go home PT Goal Formulation: With patient/family Time For Goal Achievement: 08/19/23 Potential to Achieve Goals: Good    Frequency Min 1X/week     Co-evaluation               AM-PAC PT "6 Clicks" Mobility  Outcome Measure Help needed turning from your back to your side while in a flat bed without using bedrails?: None Help needed moving from lying on your back to sitting on the side of a flat bed without using bedrails?: A Little Help needed moving to and from a bed to a chair (including a wheelchair)?: None Help needed standing up from a chair using your arms (e.g., wheelchair or bedside chair)?: None Help needed to walk in hospital room?: A Little Help needed climbing 3-5 steps with a railing? : A Little 6 Click  Score: 21    End of Session Equipment Utilized During Treatment: Gait belt Activity Tolerance: Patient limited by fatigue Patient left: in chair;with call bell/phone within reach;with chair alarm set;with family/visitor present Nurse Communication: Mobility status PT Visit Diagnosis: Unsteadiness on feet (R26.81);Muscle weakness (generalized) (M62.81)    Time: 2725-3664 PT Time Calculation (min) (ACUTE ONLY): 15 min   Charges:   PT Evaluation $PT Eval Low Complexity: 1 Low   PT General Charges $$ ACUTE PT VISIT: 1 Visit         Donna Bernard, PT, MPT   Ina Homes 08/05/2023, 12:47 PM

## 2023-08-05 NOTE — Plan of Care (Signed)
  Problem: Education: Goal: Knowledge of General Education information will improve Description Including pain rating scale, medication(s)/side effects and non-pharmacologic comfort measures Outcome: Progressing   

## 2023-08-05 NOTE — Discharge Instructions (Signed)
You can apply for medicare extra help at the following website:  [email or web address]

## 2023-08-05 NOTE — TOC Benefit Eligibility Note (Signed)
Patient Product/process development scientist completed.    The patient is insured through [employer]. Patient has Medicare and is not eligible for a copay card, but may be able to apply for patient assistance, if available.    Ran test claim for Eliquis 5 mg and the current 30 day co-pay is $548.51 due to a $545.00 deductibe.   This test claim was processed through [facility]- copay amounts may vary at other pharmacies due to pharmacy/plan contracts, or as the patient moves through the different stages of their insurance plan.     Roland Earl, CPHT Pharmacy Technician III Certified Patient Advocate [facility] Pharm[facility] Patient Advocate Team Direct Number: [phone]  Fax: [phone]

## 2023-08-05 NOTE — Progress Notes (Signed)
Progress Note    Larry [name]  ZOX:[id number] DOB: March 21, 1951  DOA: 07/29/2023 PCP: Rayetta Humphrey, MD      Brief Narrative:    Medical records reviewed and are as summarized below:  Larry [name] is a 72 y.o. male male with medical history significant for essential hypertension, COPD who presented to the emergency room with acute onset of worsening dyspnea with associated cough productive of clear sputum over the last couple weeks with no fever or chills.  He also reported orthopnea, paroxysmal nocturnal dyspnea, dyspnea on exertion.  On presentation he was found to have elevated blood pressure at 175/127, tachycardic at 138, tachypneic and hypoxic requiring BiPAP. ABG with pH of 7.44 and pCO2 of 30 with pO2 of 81 and O2 sat of 97.4% with bicarbonate of 20.  CBC showed mild hypokalemia of 3.4 and blood glucose of 189 with CO2 of 21 and AST 46 total protein 6.2 total bili 1.3 and BUN and creatinine were 16 and 1.44 above previous levels in 2020.  I sensitive troponin I was 286 and later 481 with a BNP of 1281.2.  CBC was within normal.  INR is 1.2 and PT 15.4 with PTT of 25.  Respiratory panel came back negative. EKG with sinus tachycardia and premature supraventricular complexes with RBBB and Q waves in septal leads, T wave inversion inferiorly and laterally. CXR with patchy airspace opacity may be due to atelectasis or pneumonia with suspected small bilateral pleural effusions.  The patient was given 40 mg of IV Lasix 125 mg of IV Solu-Medrol 2 g of IV magnesium sulfate and DuoNeb. He was later started on IV heparin with increasing troponin I.   10/31: Vital stable, able to wean off from BiPAP, currently on 4 L of oxygen with no baseline oxygen use.  Renal function seems to be at baseline of 1.4-1.5.  Slowly rising troponin currently at 793. Echocardiogram with a EF of 30 to 35%, global hypokinesis and mildly dilated LV cavity.  Patient had normal EF on an echo done last  year.  Cardiology is on board and likely will need ischemic workup.  Patient apparently stopped taking all of his medications at home for concern of long-term side effects.  11/1: Vital stable, some improvement in creatinine to 1.4, slight worsening of leukocytosis but patient is on steroid.  Troponin continued to rise at 2627, new diagnosis of HFrEF likely patient has NSTEMI.  Still significant orthopnea, cardiology would like to diurese more before taking him to Cath Lab.  11/2: Patient with some improved orthopnea, increasing creatinine so decreased Lasix to daily, cardiac cath on Monday.  11/3: Vital stable, creatinine with some improvement to 1.54 today.  Cath tomorrow.  11/4: Vital stable, orthopnea improved.  Cardiac cath which was originally planned for today got canceled for tomorrow.  11/5: Vital remains stable, Lasix was switched with p.o., remained on heparin infusion and going for cardiac catheterization later today.      Assessment/Plan:   Principal Problem:   Acute respiratory failure with hypoxia (HCC) Active Problems:   Non-STEMI (non-ST elevated myocardial infarction) (HCC)   Acute HFrEF (heart failure with reduced ejection fraction) (HCC)   COPD exacerbation (HCC)   Chronic kidney disease, stage 3a (HCC)   Peripheral neuropathy   Dyslipidemia   Acute congestive heart failure (HCC)    Body mass index is 35.18 kg/m.  (obesity)   Acute systolic heart failure: Repeat 2D echo on 05/2023 showed EF estimated  at less than 20%, global hypokinesis of left ventricle, moderately reduced right ventricular systolic function. Continue IV Lasix Continue carvedilol, losartan and Aldactone. Patient to be fitted with LifeVest tomorrow.   Acute NSTEMI: Previously treated with IV heparin drip.  S/p left heart cath on 08/04/2023 Left ventriculogram Severely depressed left ventricular function anterior apical hypokinesis apical thrombus EF less than 20% left ventricular  enlargement Coronary artery disease Left main LAD ostial LAD, 90% proximal,100% CTO Circumflex large ostial 75% minor irregularities RCA large ectasia minor irregularities 90% proximal PL 100% proximal PDA Right dominant system  He is a poor surgical candidate at this time per cardiologist. Aspirin, Plavix and Eliquis for 2 weeks followed by only Plavix and Eliquis antithrombotic therapy.   Acute hypoxemic respiratory failure: Resolved.  He is tolerating room air.   COPD exacerbation: Continue course of steroids.  Bronchodilators as needed.   Comorbidities include peripheral neuropathy, dyslipidemia, CKD stage IIIa   Diet Order             Diet Heart Room service appropriate? Yes; Fluid consistency: Thin  Diet effective now                            Consultants: Cardiologist  Procedures: Left heart cath    Medications:    allopurinol  200 mg Oral Daily   apixaban  5 mg Oral BID   aspirin  81 mg Oral Daily   carvedilol  25 mg Oral BID WC   clopidogrel  75 mg Oral Q breakfast   famotidine  20 mg Oral BID   furosemide  40 mg Intravenous BID   insulin aspart  0-5 Units Subcutaneous QHS   insulin aspart  0-9 Units Subcutaneous TID WC   losartan  50 mg Oral BID   potassium chloride  10 mEq Oral Daily   rosuvastatin  40 mg Oral Daily   sodium chloride flush  3 mL Intravenous Q12H   spironolactone  25 mg Oral Daily   Continuous Infusions:  sodium chloride       Anti-infectives (From admission, onward)    None              Family Communication/Anticipated D/C date and plan/Code Status   DVT prophylaxis:  apixaban (ELIQUIS) tablet 5 mg     Code Status: Full Code  Family Communication: Plan discussed with Steward Drone, sister, at the bedside Disposition Plan: Plan to discharge home   Status is: Inpatient Remains inpatient appropriate because: Acute MI and CHF       Subjective:   Interval events noted.  He feels better today.   No chest pain or shortness of breath.  He was able to ambulate with physical therapist without chest pain or shortness of breath.  However, he felt tired after walking for a while.  Steward Drone, sister, was at the bedside.  Echo tech was at the bedside.  Objective:    Vitals:   08/05/23 0040 08/05/23 0431 08/05/23 0500 08/05/23 1602  BP: 132/80 (!) 137/94  126/82  Pulse: 66 81  60  Resp: 20 20  18   Temp: 97.6 F (36.4 C) 98.7 F (37.1 C)  (!) 97.4 F (36.3 C)  TempSrc:    Oral  SpO2: 92% 91%  95%  Weight:   79 kg   Height:       No data found.   Intake/Output Summary (Last 24 hours) at 08/05/2023 1628 Last data filed at 08/04/2023 2105  Gross per 24 hour  Intake 363 ml  Output 2325 ml  Net -1962 ml   Filed Weights   08/01/23 0339 08/02/23 0400 08/05/23 0500  Weight: 76.3 kg 77.7 kg 79 kg    Exam:  GEN: NAD SKIN: Warm and dry EYES: EOMI ENT: MMM CV: RRR PULM: CTA B ABD: soft, ND, NT, +BS CNS: AAO x 3, non focal EXT: No edema or tenderness        Data Reviewed:   I have personally reviewed following labs and imaging studies:  Labs: Labs show the following:   Basic Metabolic Panel: Recent Labs  Lab 07/30/23 0040 07/30/23 0426 08/01/23 0258 08/02/23 0536 08/03/23 0442 08/04/23 0517 08/05/23 0415  NA 138   < > 134* 135 135 136 140  K 3.4*   < > 4.0 4.0 4.0 3.5 3.6  CL 104   < > 100 105 102 102 105  CO2 21*   < > 23 22 23 23 23   GLUCOSE 189*   < > 283* 185* 191* 115* 121*  BUN 16   < > 45* 43* 41* 35* 31*  CREATININE 1.44*   < > 1.64* 1.54* 1.44* 1.55* 1.55*  CALCIUM 8.5*   < > 8.4* 8.5* 8.5* 8.8* 8.7*  MG 2.0  --   --   --   --   --   --    < > = values in this interval not displayed.   GFR Estimated Creatinine Clearance: 36.7 mL/min (A) (by C-G formula based on SCr of 1.55 mg/dL (H)). Liver Function Tests: Recent Labs  Lab 07/30/23 0040  AST 46*  ALT 38  ALKPHOS 77  BILITOT 1.3*  PROT 6.2*  ALBUMIN 3.6   No results for input(s):  "LIPASE", "AMYLASE" in the last 168 hours. No results for input(s): "AMMONIA" in the last 168 hours. Coagulation profile Recent Labs  Lab 07/30/23 0201  INR 1.2    CBC: Recent Labs  Lab 07/30/23 0040 07/30/23 0426 08/01/23 0258 08/02/23 0536 08/03/23 0442 08/04/23 0517 08/05/23 0415  WBC 7.9   < > 14.4* 10.0 12.3* 11.5* 9.0  NEUTROABS 6.2  --   --   --   --   --   --   HGB 15.8   < > 13.8 15.0 14.2 15.1 15.1  HCT 46.3   < > 40.8 44.5 41.1 44.1 43.6  MCV 88.5   < > 89.9 89.4 88.0 87.2 86.2  PLT 177   < > 175 179 158 158 152   < > = values in this interval not displayed.   Cardiac Enzymes: No results for input(s): "CKTOTAL", "CKMB", "CKMBINDEX", "TROPONINI" in the last 168 hours. BNP (last 3 results) No results for input(s): "PROBNP" in the last 8760 hours. CBG: Recent Labs  Lab 08/04/23 1915 08/04/23 2040 08/05/23 0825 08/05/23 1207 08/05/23 1556  GLUCAP 158* 127* 128* 160* 172*   D-Dimer: No results for input(s): "DDIMER" in the last 72 hours. Hgb A1c: No results for input(s): "HGBA1C" in the last 72 hours. Lipid Profile: No results for input(s): "CHOL", "HDL", "LDLCALC", "TRIG", "CHOLHDL", "LDLDIRECT" in the last 72 hours. Thyroid function studies: No results for input(s): "TSH", "T4TOTAL", "T3FREE", "THYROIDAB" in the last 72 hours.  Invalid input(s): "FREET3" Anemia work up: No results for input(s): "VITAMINB12", "FOLATE", "FERRITIN", "TIBC", "IRON", "RETICCTPCT" in the last 72 hours. Sepsis Labs: Recent Labs  Lab 08/02/23 0536 08/03/23 0442 08/04/23 0517 08/05/23 0415  WBC 10.0 12.3* 11.5* 9.0  Microbiology Recent Results (from the past 240 hour(s))  Resp panel by RT-PCR (RSV, Flu A&B, Covid) Anterior Nasal Swab     Status: None   Collection Time: 07/30/23 12:40 AM   Specimen: Anterior Nasal Swab  Result Value Ref Range Status   SARS Coronavirus 2 by RT PCR NEGATIVE NEGATIVE Final    Comment: (NOTE) SARS-CoV-2 target nucleic acids are NOT  DETECTED.  The SARS-CoV-2 RNA is generally detectable in upper respiratory specimens during the acute phase of infection. The lowest concentration of SARS-CoV-2 viral copies this assay can detect is 138 copies/mL. A negative result does not preclude SARS-Cov-2 infection and should not be used as the sole basis for treatment or other patient management decisions. A negative result may occur with  improper specimen collection/handling, submission of specimen other than nasopharyngeal swab, presence of viral mutation(s) within the areas targeted by this assay, and inadequate number of viral copies(<138 copies/mL). A negative result must be combined with clinical observations, patient history, and epidemiological information. The expected result is Negative.  Fact Sheet for Patients:  BloggerCourse.com  Fact Sheet for Healthcare Providers:  SeriousBroker.it  This test is no t yet approved or cleared by the [place] FDA and  has been authorized for detection and/or diagnosis of SARS-CoV-2 by FDA under an Emergency Use Authorization (EUA). This EUA will remain  in effect (meaning this test can be used) for the duration of the COVID-19 declaration under Section 564(b)(1) of the Act, 21 U.S.C.section 360bbb-3(b)(1), unless the authorization is terminated  or revoked sooner.       Influenza A by PCR NEGATIVE NEGATIVE Final   Influenza B by PCR NEGATIVE NEGATIVE Final    Comment: (NOTE) The Xpert Xpress SARS-CoV-2/FLU/RSV plus assay is intended as an aid in the diagnosis of influenza from Nasopharyngeal swab specimens and should not be used as a sole basis for treatment. Nasal washings and aspirates are unacceptable for Xpert Xpress SARS-CoV-2/FLU/RSV testing.  Fact Sheet for Patients: BloggerCourse.com  Fact Sheet for Healthcare Providers: SeriousBroker.it  This test is not yet  approved or cleared by the [place] FDA and has been authorized for detection and/or diagnosis of SARS-CoV-2 by FDA under an Emergency Use Authorization (EUA). This EUA will remain in effect (meaning this test can be used) for the duration of the COVID-19 declaration under Section 564(b)(1) of the Act, 21 U.S.C. section 360bbb-3(b)(1), unless the authorization is terminated or revoked.     Resp Syncytial Virus by PCR NEGATIVE NEGATIVE Final    Comment: (NOTE) Fact Sheet for Patients: BloggerCourse.com  Fact Sheet for Healthcare Providers: SeriousBroker.it  This test is not yet approved or cleared by the [place] FDA and has been authorized for detection and/or diagnosis of SARS-CoV-2 by FDA under an Emergency Use Authorization (EUA). This EUA will remain in effect (meaning this test can be used) for the duration of the COVID-19 declaration under Section 564(b)(1) of the Act, 21 U.S.C. section 360bbb-3(b)(1), unless the authorization is terminated or revoked.  Performed at [facility], [address], [place], [place] [address]     Procedures and diagnostic studies:  ECHOCARDIOGRAM LIMITED  Result Date: 08/05/2023    ECHOCARDIOGRAM LIMITED REPORT   Patient Name:   Larry [name] Date of Exam: 08/05/2023 Medical Rec #:  [id number]          Height:       59.0 in Accession #:    [id number]         Weight:  174.2 lb Date of Birth:  Mar 25, 1951          BSA:          1.739 m Patient Age:    72 years           BP:           137/94 mmHg Patient Gender: M                  HR:           55 bpm. Exam Location:  [facility] Procedure: Limited Echo and Intracardiac Opacification Agent Indications:     Ischemic cardiomyopathy, CHF  History:         Patient has prior history of Echocardiogram examinations, most                  recent 07/30/2023. Cardiomyopathy and CHF, Acute MI, COPD; Risk                  Factors:Dyslipidemia.  CKD.  Sonographer:     Mikki Harbor Referring Phys:  [id number] DWAYNE D CALLWOOD Diagnosing Phys: Clotilde Dieter  Sonographer Comments: Technically difficult study due to poor echo windows. IMPRESSIONS  1. 2x2 cm apical thrombus noted. Left ventricular ejection fraction, by estimation, is <20%. The left ventricle has severely decreased function. The left ventricle demonstrates global hypokinesis. Left ventricular diastolic function could not be evaluated.  2. Right ventricular systolic function is moderately reduced. The right ventricular size is normal.  3. The mitral valve was not assessed.  4. The aortic valve was not assessed. FINDINGS  Left Ventricle: 2x2 cm apical thrombus noted. Left ventricular ejection fraction, by estimation, is <20%. The left ventricle has severely decreased function. The left ventricle demonstrates global hypokinesis. Definity contrast agent was given IV to delineate the left ventricular endocardial borders. The left ventricular internal cavity size was normal in size. Left ventricular diastolic function could not be evaluated. Right Ventricle: The right ventricular size is normal. No increase in right ventricular wall thickness. Right ventricular systolic function is moderately reduced. Mitral Valve: The mitral valve was not assessed. Tricuspid Valve: The tricuspid valve is not assessed. Aortic Valve: The aortic valve was not assessed. Pulmonic Valve: The pulmonic valve was not assessed. LEFT VENTRICLE PLAX 2D LVIDd:         4.90 cm LVIDs:         4.20 cm LV PW:         1.00 cm LV IVS:        1.30 cm LVOT diam:     2.10 cm LVOT Area:     3.46 cm  LEFT ATRIUM         Index LA diam:    3.90 cm 2.24 cm/m   AORTA Ao Root diam: 3.30 cm  SHUNTS Systemic Diam: 2.10 cm Rozell Searing Custovic Electronically signed by Clotilde Dieter Signature Date/Time: 08/05/2023/1:53:56 PM    Final                LOS: 6 days   Lakshya Mcgillicuddy  [facility]   Pager on [email or web address]. If 7PM-7AM,  please contact night-coverage at [email or web address]     08/05/2023, 4:28 PM

## 2023-08-05 NOTE — Progress Notes (Signed)
[facility] CLINIC CARDIOLOGY PROGRESS NOTE       Patient ID: BYNUM MCCULLARS MRN: [id number] DOB/AGE: 1951/01/10 72 y.o.  Admit date: 07/29/2023 Referring Physician Valente David, MD Primary Physician Rayetta Humphrey, MD Primary Cardiologist Dorothyann Peng, MD Reason for Consultation NSTEMI  HPI: NADIA TORR III is a 72 y.o. male  with a past medical history of hyperlipidemia, hypertension, type 2 diabetes mellitus, stroke, neuropathy, RLS, abdominal hernia, GERD who presented to the ED on 07/29/2023 for shortness of breath and cough for 2 weeks. Cardiology was consulted for further evaluation.   Interval history: -Mental status significantly improved today, near baseline.  -States SOB is significantly improved. Denies CP or palpitations.  -Renal function stable, diuresed 2 L yesterday. -BP elevated since late yesterday. HR remains stable.   Review of systems complete and found to be negative unless listed above    Past Medical History:  Diagnosis Date   Hypertension     History reviewed. No pertinent surgical history.  Medications Prior to Admission  Medication Sig Dispense Refill Last Dose   albuterol (VENTOLIN HFA) 108 (90 Base) MCG/ACT inhaler Inhale 2 puffs into the lungs every 4 (four) hours as needed for wheezing or shortness of breath.   prn at unknown   famotidine (PEPCID) 20 MG tablet Take 20 mg by mouth 2 (two) times daily.   07/29/2023   rosuvastatin (CRESTOR) 40 MG tablet Take 40 mg by mouth daily.   07/29/2023   allopurinol (ZYLOPRIM) 100 MG tablet Take 200 mg by mouth daily. (Patient not taking: Reported on 07/30/2023)   Not Taking   amLODipine (NORVASC) 10 MG tablet Take 10 mg by mouth daily. (Patient not taking: Reported on 07/30/2023)   Not Taking   gabapentin (NEURONTIN) 300 MG capsule Take 300 mg by mouth 3 (three) times daily. (Patient not taking: Reported on 07/30/2023)   Not Taking   hydrALAZINE (APRESOLINE) 100 MG tablet Take 100 mg by mouth 2 (two)  times daily. (Patient not taking: Reported on 07/30/2023)   Not Taking   indomethacin (INDOCIN) 25 MG capsule Take 25 mg by mouth 2 (two) times daily with a meal. (Patient not taking: Reported on 07/30/2023)   Not Taking   lisinopril (ZESTRIL) 40 MG tablet Take 40 mg by mouth daily. (Patient not taking: Reported on 07/30/2023)   Not Taking   lisinopril-hydrochlorothiazide (ZESTORETIC) 20-25 MG tablet Take 1 tablet by mouth daily. (Patient not taking: Reported on 07/30/2023)   Not Taking   metFORMIN (GLUCOPHAGE-XR) 750 MG 24 hr tablet Take 750 mg by mouth 2 (two) times daily. (Patient not taking: Reported on 07/30/2023)   Not Taking   metoprolol tartrate (LOPRESSOR) 100 MG tablet Take 100 mg by mouth 2 (two) times daily. (Patient not taking: Reported on 07/30/2023)   Not Taking   montelukast (SINGULAIR) 10 MG tablet Take 10 mg by mouth daily. (Patient not taking: Reported on 07/30/2023)   Not Taking   potassium chloride (K-DUR) 10 MEQ tablet Take 10 mEq by mouth daily. (Patient not taking: Reported on 07/30/2023)   Not Taking   primidone (MYSOLINE) 50 MG tablet Take by mouth 4 (four) times daily. (Patient not taking: Reported on 07/30/2023)   Not Taking   Social History   Socioeconomic History   Marital status: Married    Spouse name: Not on file   Number of children: Not on file   Years of education: Not on file   Highest education level: Not on file  Occupational History  Not on file  Tobacco Use   Smoking status: Never   Smokeless tobacco: Current  Substance and Sexual Activity   Alcohol use: Yes   Drug use: Never   Sexual activity: Not on file  Other Topics Concern   Not on file  Social History Narrative   Not on file   Social Determinants of Health   Financial Resource Strain: Medium Risk (01/20/2023)   Received from [facility]   Overall Financial Resource Strain (CARDIA)    Difficulty of Paying Living Expenses: Somewhat hard  Food Insecurity: No Food  Insecurity (07/31/2023)   Hunger Vital Sign    Worried About Running Out of Food in the Last Year: Never true    Ran Out of Food in the Last Year: Never true  Transportation Needs: No Transportation Needs (07/31/2023)   PRAPARE - Administrator, Civil Service (Medical): No    Lack of Transportation (Non-Medical): No  Physical Activity: Not on file  Stress: Not on file  Social Connections: Not on file  Intimate Partner Violence: Not At Risk (07/31/2023)   Humiliation, Afraid, Rape, and Kick questionnaire    Fear of Current or Ex-Partner: No    Emotionally Abused: No    Physically Abused: No    Sexually Abused: No    History reviewed. No pertinent family history.   Vitals:   08/04/23 2110 08/05/23 0040 08/05/23 0431 08/05/23 0500  BP:  132/80 (!) 137/94   Pulse:  66 81   Resp:  20 20   Temp:  97.6 F (36.4 C) 98.7 F (37.1 C)   TempSrc:      SpO2: 96% 92% 91%   Weight:    79 kg  Height:        PHYSICAL EXAM General: Elderly appearing male, well nourished, in no acute distress sitting upright in bedside chair with sister present at bedside.  HEENT: Normocephalic and atraumatic. Neck: No JVD.   Lungs: Normal respiratory effort on room air. Clear bilaterally to auscultation. No wheezes, crackles.  Heart: HRRR. Normal S1 and S2 without gallops or murmurs.  Abdomen: Non-distended appearing.  Msk: Normal strength and tone for age. Extremities: Warm and well perfused. No clubbing, cyanosis. No edema.  Neuro: Alert and oriented X 3. Psych: Answers questions appropriately.   Labs: Basic Metabolic Panel: Recent Labs    08/04/23 0517 08/05/23 0415  NA 136 140  K 3.5 3.6  CL 102 105  CO2 23 23  GLUCOSE 115* 121*  BUN 35* 31*  CREATININE 1.55* 1.55*  CALCIUM 8.8* 8.7*   Liver Function Tests: No results for input(s): "AST", "ALT", "ALKPHOS", "BILITOT", "PROT", "ALBUMIN" in the last 72 hours.  No results for input(s): "LIPASE", "AMYLASE" in the last 72  hours. CBC: Recent Labs    08/04/23 0517 08/05/23 0415  WBC 11.5* 9.0  HGB 15.1 15.1  HCT 44.1 43.6  MCV 87.2 86.2  PLT 158 152   Cardiac Enzymes: No results for input(s): "CKTOTAL", "CKMB", "CKMBINDEX", "TROPONINIHS" in the last 72 hours.  BNP: No results for input(s): "BNP" in the last 72 hours.  D-Dimer: No results for input(s): "DDIMER" in the last 72 hours. Hemoglobin A1C: No results for input(s): "HGBA1C" in the last 72 hours. Fasting Lipid Panel: No results for input(s): "CHOL", "HDL", "LDLCALC", "TRIG", "CHOLHDL", "LDLDIRECT" in the last 72 hours. Thyroid Function Tests: No results for input(s): "TSH", "T4TOTAL", "T3FREE", "THYROIDAB" in the last 72 hours.  Invalid input(s): "FREET3" Anemia Panel: No results  for input(s): "VITAMINB12", "FOLATE", "FERRITIN", "TIBC", "IRON", "RETICCTPCT" in the last 72 hours.   Radiology: ECHOCARDIOGRAM COMPLETE  Result Date: 07/30/2023    ECHOCARDIOGRAM REPORT   Patient Name:   ZAHIR EISENHOUR III Date of Exam: 07/30/2023 Medical Rec #:  [id number]          Height:       59.0 in Accession #:    [id number]         Weight:       176.0 lb Date of Birth:  [birth year]          BSA:          1.747 m Patient Age:    72 years           BP:           124/102 mmHg Patient Gender: M                  HR:           118 bpm. Exam Location:  [facility] Procedure: 2D Echo, Cardiac Doppler, Color Doppler and Intracardiac            Opacification Agent Indications:     CHF  History:         Patient has no prior history of Echocardiogram examinations.                  CHF, Acute MI, COPD; Risk Factors:Dyslipidemia.  Sonographer:     Mikki Harbor Referring Phys:  [id number] JAN A MANSY Diagnosing Phys: Julien Nordmann MD  Sonographer Comments: Image acquisition challenging due to COPD. IMPRESSIONS  1. Left ventricular ejection fraction, by estimation, is 30 to 35%. Left ventricular ejection fraction by PLAX is 32 %. The left ventricle has moderately decreased function.  The left ventricle demonstrates global hypokinesis. The left ventricular internal cavity size was mildly dilated. Left ventricular diastolic parameters are indeterminate.  2. Right ventricular systolic function is normal. The right ventricular size is normal. Tricuspid regurgitation signal is inadequate for assessing PA pressure. The estimated right ventricular systolic pressure is 17.4 mmHg.  3. The mitral valve is normal in structure. Mild to moderate mitral valve regurgitation. No evidence of mitral stenosis.  4. The aortic valve is normal in structure. Aortic valve regurgitation is mild. No aortic stenosis is present.  5. The inferior vena cava is normal in size with greater than 50% respiratory variability, suggesting right atrial pressure of 3 mmHg. FINDINGS  Left Ventricle: Left ventricular ejection fraction, by estimation, is 30 to 35%. Left ventricular ejection fraction by PLAX is 32 %. The left ventricle has moderately decreased function. The left ventricle demonstrates global hypokinesis. Definity contrast agent was given IV to delineate the left ventricular endocardial borders. The left ventricular internal cavity size was mildly dilated. There is no left ventricular hypertrophy. Left ventricular diastolic parameters are indeterminate. Right Ventricle: The right ventricular size is normal. No increase in right ventricular wall thickness. Right ventricular systolic function is normal. Tricuspid regurgitation signal is inadequate for assessing PA pressure. The tricuspid regurgitant velocity is 1.90 m/s, and with an assumed right atrial pressure of 3 mmHg, the estimated right ventricular systolic pressure is 17.4 mmHg. Left Atrium: Left atrial size was normal in size. Right Atrium: Right atrial size was normal in size. Pericardium: There is no evidence of pericardial effusion. Mitral Valve: The mitral valve is normal in structure. Mild to moderate mitral valve regurgitation. No evidence of mitral valve  stenosis. MV peak gradient,  5.5 mmHg. The mean mitral valve gradient is 2.0 mmHg. Tricuspid Valve: The tricuspid valve is normal in structure. Tricuspid valve regurgitation is mild . No evidence of tricuspid stenosis. Aortic Valve: The aortic valve is normal in structure. Aortic valve regurgitation is mild. No aortic stenosis is present. Aortic valve mean gradient measures 3.0 mmHg. Aortic valve peak gradient measures 4.8 mmHg. Aortic valve area, by VTI measures 2.39 cm. Pulmonic Valve: The pulmonic valve was normal in structure. Pulmonic valve regurgitation is not visualized. No evidence of pulmonic stenosis. Aorta: The aortic root is normal in size and structure. Venous: The inferior vena cava is normal in size with greater than 50% respiratory variability, suggesting right atrial pressure of 3 mmHg. IAS/Shunts: No atrial level shunt detected by color flow Doppler.  LEFT VENTRICLE PLAX 2D LV EF:         Left            Diastology                ventricular     LV e' medial:    10.80 cm/s                ejection        LV E/e' medial:  9.1                fraction by     LV e' lateral:   9.14 cm/s                PLAX is 32      LV E/e' lateral: 10.8                %. LVIDd:         4.70 cm LVIDs:         4.00 cm LV PW:         1.30 cm LV IVS:        1.20 cm LVOT diam:     2.00 cm LV SV:         43 LV SV Index:   25 LVOT Area:     3.14 cm  LV Volumes (MOD) LV vol d, MOD    96.6 ml A4C: LV vol s, MOD    63.9 ml A4C: LV SV MOD A4C:   96.6 ml RIGHT VENTRICLE RV Basal diam:  3.20 cm RV Mid diam:    2.80 cm RV S prime:     12.60 cm/s LEFT ATRIUM             Index        RIGHT ATRIUM           Index LA diam:        3.80 cm 2.18 cm/m   RA Area:     12.80 cm LA Vol (A2C):   47.7 ml 27.31 ml/m  RA Volume:   33.40 ml  19.12 ml/m LA Vol (A4C):   53.4 ml 30.57 ml/m LA Biplane Vol: 51.4 ml 29.42 ml/m  AORTIC VALVE                    PULMONIC VALVE AV Area (Vmax):    2.46 cm     PV Vmax:       0.75 m/s AV Area (Vmean):    2.12 cm     PV Peak grad:  2.3 mmHg AV Area (VTI):     2.39 cm AV Vmax:  109.50 cm/s AV Vmean:          81.750 cm/s AV VTI:            0.180 m AV Peak Grad:      4.8 mmHg AV Mean Grad:      3.0 mmHg LVOT Vmax:         85.67 cm/s LVOT Vmean:        55.067 cm/s LVOT VTI:          0.136 m LVOT/AV VTI ratio: 0.76  AORTA Ao Root diam: 3.30 cm MITRAL VALVE               TRICUSPID VALVE MV Area (PHT): 7.32 cm    TR Peak grad:   14.4 mmHg MV Area VTI:   1.95 cm    TR Vmax:        190.00 cm/s MV Peak grad:  5.5 mmHg MV Mean grad:  2.0 mmHg    SHUNTS MV Vmax:       1.17 m/s    Systemic VTI:  0.14 m MV Vmean:      65.6 cm/s   Systemic Diam: 2.00 cm MV Decel Time: 104 msec MV E velocity: 98.38 cm/s MV A velocity: 84.35 cm/s MV E/A ratio:  1.17 Julien Nordmann MD Electronically signed by Julien Nordmann MD Signature Date/Time: 07/30/2023/1:36:14 PM    Final    DG Chest 1 View  Result Date: 07/30/2023 CLINICAL DATA:  Shortness of breath EXAM: CHEST  1 VIEW COMPARISON:  05/19/2014 FINDINGS: Low lung volumes accentuate pulmonary vascularity. Normal cardiomediastinal silhouette. Patchy bilateral airspace opacities. Retrocardiac atelectasis or consolidation. Suspected small bilateral pleural effusions. No pneumothorax. No displaced rib fractures. IMPRESSION: Patchy airspace opacities may be due to atelectasis or pneumonia. Suspected small bilateral pleural effusions. Electronically Signed   By: Minerva Fester M.D.   On: 07/30/2023 02:30    ECHO As above (EF 30-35%)  TELEMETRY reviewed by me 08/05/2023: sinus rhythm rate 70s  EKG reviewed by me: Sinus tachy with PACs, RBBB, rate 133 bpm (non-acute)  Pertinent Cardiac History CT Coronary 11/24/2022: Aorta normal caliber without dissection. Atherosclerotic calcifications descending thoracic aorta. No pericardial effusion. Visualized pulmonary arteries patent.  Stress Test 08/07/2022:  Abnormal myocardial perfusion scar large area of anterior apical inferior  apical defect with significant reversibility consistent with ischemia overall left ventricular function is normal around 59% no clear wall motion abnormality study is consistent with ischemia this is a  high risk scan recommend further evaluation invasively. Regional wall motion reveals normal myocardial thickening and wall motion. LVEF 59%  Data reviewed by me 08/05/2023: last 24h vitals tele labs imaging I/O hospitalist progress note  Principal Problem:   Acute respiratory failure with hypoxia (HCC) Active Problems:   Non-STEMI (non-ST elevated myocardial infarction) (HCC)   Dyslipidemia   Peripheral neuropathy   COPD exacerbation (HCC)   Acute congestive heart failure (HCC)   Acute HFrEF (heart failure with reduced ejection fraction) (HCC)   Chronic kidney disease, stage 3a (HCC)    ASSESSMENT AND PLAN:  SATISH HAMMERS III is a 72 y.o. male  with a past medical history of hyperlipidemia, hypertension, type 2 diabetes mellitus, stroke, neuropathy, RLS, abdominal hernia, GERD who presented to the ED on 07/29/2023 for shortness of breath and cough for 2 weeks. Cardiology was consulted for further evaluation.   # Acute HFrEF  # Ischemic cardiomyopathy # Apical thrombus # Hypertension Upon admission patient reports worsening shortness of breath for past 2 weeks.  Patient states he can't walk without getting SOB and this is new for him. BNP 1,281. Did require BiPAP initially in the ED, now weaned to room air. Echo this admission with newly reduced EF to 30-35%. LHC yesterday with EF <20%, apical akinesis, apical thrombus.  -Start eliquis 5 mg twice daily.  -Start spironolactone 25 mg daily. Continue losartan 50 mg twice daily, carvedilol 25 mg twice daily. Further additions to GDMT with SGLT2i/transitioning to [facility] limited by coverage concerns.  -Will likely transition to po diuretics tomorrow.  -Will set patient up with LifeVest given significantly reduced EF.  -Repeat echo for formal  evaluation of EF.   #NSTEMI #Hyperlipidemia Patient reporting worsening DOE for 2 weeks. Denies chest pain or pressure. Upon admission patient has been tachycardiac with blood pressure slightly elevated. Troponin 286 > 481 > 793. Patient received 324 mg ASA. EKG showed sinus tachy with PACs, RBBB, rate 133 bpm, which is non-acute. Most recent lipid panel 06/2023 with TC 109 and LDL 46. LHC yesterday with multivessel disease, would likely need bypass evaluation in the future but not currently surgical candidate given significant LV dysfunction -Continue rosuvastatin 40 mg, aspirin 81 mg daily, and plavix 75 mg daily. Will plan for triple therapy x2 weeks then will discontinue aspirin.   #Chronic kidney disease 3a Cr 1.44 on presentation, variable throughout admission. Up to 1.64 on 11/2 now downtrending to 1.55 on AM labs. Baseline around 1.4-1.5.  -Monitor renal function closely with diureses.  -Monitor electrolytes, strict I's & O's, daily weights      This patient's plan of care was discussed and created with Dr. Melton Alar and she is in agreement.  Signed: Gale Journey, PA-C  08/05/2023, 11:21 AM [facility] Cardiology

## 2023-08-05 NOTE — Progress Notes (Signed)
*  PRELIMINARY RESULTS* Echocardiogram A Limited 2D Echocardiogram has been performed.  [name] 08/05/2023, 12:38 PM

## 2023-08-06 ENCOUNTER — Other Ambulatory Visit: Payer: Self-pay

## 2023-08-06 ENCOUNTER — Telehealth (HOSPITAL_COMMUNITY): Payer: Self-pay

## 2023-08-06 ENCOUNTER — Other Ambulatory Visit (HOSPITAL_COMMUNITY): Payer: Self-pay

## 2023-08-06 DIAGNOSIS — I5021 Acute systolic (congestive) heart failure: Secondary | ICD-10-CM | POA: Diagnosis not present

## 2023-08-06 DIAGNOSIS — J9601 Acute respiratory failure with hypoxia: Secondary | ICD-10-CM | POA: Diagnosis not present

## 2023-08-06 DIAGNOSIS — I214 Non-ST elevation (NSTEMI) myocardial infarction: Secondary | ICD-10-CM | POA: Diagnosis not present

## 2023-08-06 LAB — BASIC METABOLIC PANEL
Anion gap: 11 (ref 5–15)
BUN: 33 mg/dL — ABNORMAL HIGH (ref 8–23)
CO2: 22 mmol/L (ref 22–32)
Calcium: 8.7 mg/dL — ABNORMAL LOW (ref 8.9–10.3)
Chloride: 99 mmol/L (ref 98–111)
Creatinine, Ser: 1.9 mg/dL — ABNORMAL HIGH (ref 0.61–1.24)
GFR, Estimated: 37 mL/min — ABNORMAL LOW (ref >60)
Glucose, Bld: 178 mg/dL — ABNORMAL HIGH (ref 70–99)
Potassium: 3.3 mmol/L — ABNORMAL LOW (ref 3.5–5.1)
Sodium: 134 mmol/L — ABNORMAL LOW (ref 135–145)

## 2023-08-06 LAB — GLUCOSE, CAPILLARY
Glucose-Capillary: 183 mg/dL — ABNORMAL HIGH (ref 70–99)
Glucose-Capillary: 173 mg/dL — ABNORMAL HIGH (ref 70–99)

## 2023-08-06 LAB — LIPOPROTEIN A (LPA): Lipoprotein (a): 55.2 nmol/L — ABNORMAL HIGH (ref <75.0)

## 2023-08-06 LAB — CARDIAC CATHETERIZATION: Cath EF Quantitative: 10 %

## 2023-08-06 MED ORDER — SPIRONOLACTONE 25 MG PO TABS
25.0000 mg | ORAL_TABLET | Freq: Every day | ORAL | 0 refills | Status: AC
  Filled 2023-08-06: qty 30, 30d supply, fill #0

## 2023-08-06 MED ORDER — APIXABAN 5 MG PO TABS
5.0000 mg | ORAL_TABLET | Freq: Two times a day (BID) | ORAL | 0 refills | Status: AC
  Filled 2023-08-06: qty 60, 30d supply, fill #0

## 2023-08-06 MED ORDER — CARVEDILOL 12.5 MG PO TABS: 12.5000 mg | ORAL_TABLET | Freq: Two times a day (BID) | ORAL | Status: DC

## 2023-08-06 MED ORDER — FUROSEMIDE 40 MG PO TABS
40.0000 mg | ORAL_TABLET | Freq: Every day | ORAL | 0 refills | Status: AC
  Filled 2023-08-06: qty 30, 30d supply, fill #0

## 2023-08-06 MED ORDER — FUROSEMIDE 40 MG PO TABS: 40.0000 mg | ORAL_TABLET | Freq: Every day | ORAL | Status: DC

## 2023-08-06 MED ORDER — CARVEDILOL 12.5 MG PO TABS
12.5000 mg | ORAL_TABLET | Freq: Two times a day (BID) | ORAL | 0 refills | Status: AC
  Filled 2023-08-06: qty 60, 30d supply, fill #0

## 2023-08-06 MED ORDER — FUROSEMIDE 20 MG PO TABS: 20.0000 mg | ORAL_TABLET | Freq: Every day | ORAL | Status: DC

## 2023-08-06 MED ORDER — CLOPIDOGREL BISULFATE 75 MG PO TABS
75.0000 mg | ORAL_TABLET | Freq: Every day | ORAL | 0 refills | Status: AC
  Filled 2023-08-06: qty 30, 30d supply, fill #0

## 2023-08-06 MED ORDER — ASPIRIN 81 MG PO CHEW
81.0000 mg | CHEWABLE_TABLET | Freq: Every day | ORAL | 0 refills | Status: AC
  Filled 2023-08-06: qty 12, 12d supply, fill #0

## 2023-08-06 MED ORDER — LOSARTAN POTASSIUM 50 MG PO TABS
50.0000 mg | ORAL_TABLET | Freq: Two times a day (BID) | ORAL | 0 refills | Status: AC
  Filled 2023-08-06: qty 30, 15d supply, fill #0

## 2023-08-06 NOTE — Progress Notes (Signed)
Education Assessment and Provision:  Detailed education and instructions provided on heart failure disease management including the following:  Signs and symptoms of Heart Failure When to call the physician Importance of daily weights Low sodium diet Fluid restriction Medication management Anticipated future follow-up appointments  Patient education given on each of the above topics.  Patient acknowledges understanding via teach back method and acceptance of all instructions.  Education Materials:  "Living Better With Heart Failure" Booklet, HF zone tool, & Daily Weight Tracker Tool.  Patient has scale at home: Yes Patient has pill box at home: Yes    Patient will follow-up with [facility] after discharge.   Roxy Horseman, RN, BSN [facility] Heart Failure Navigator Secure Chat Only

## 2023-08-06 NOTE — Progress Notes (Signed)
[facility] CLINIC CARDIOLOGY PROGRESS NOTE       Patient ID: Larry [name] MRN: [id number] DOB/AGE: 72-Sep-1952 72 y.o.  Admit date: 07/29/2023 Referring Physician Valente David, MD Primary Physician Rayetta Humphrey, MD Primary Cardiologist Dorothyann Peng, MD Reason for Consultation NSTEMI  HPI: Larry [name] [name] is a 72 y.o. male  with a past medical history of hyperlipidemia, hypertension, type 2 diabetes mellitus, stroke, neuropathy, RLS, abdominal hernia, GERD who presented to the ED on 07/29/2023 for shortness of breath and cough for 2 weeks. Cardiology was consulted for further evaluation.   Interval history: -Patient feeling well this AM, denies CP or SOB.  -BP and HR remain stable. Tolerating meds well. -Bump in Cr this AM to 1.9. Endorses good UOP.   Review of systems complete and found to be negative unless listed above    Past Medical History:  Diagnosis Date   Hypertension     History reviewed. No pertinent surgical history.  Medications Prior to Admission  Medication Sig Dispense Refill Last Dose   albuterol (VENTOLIN HFA) 108 (90 Base) MCG/ACT inhaler Inhale 2 puffs into the lungs every 4 (four) hours as needed for wheezing or shortness of breath.   prn at unknown   famotidine (PEPCID) 20 MG tablet Take 20 mg by mouth 2 (two) times daily.   07/29/2023   rosuvastatin (CRESTOR) 40 MG tablet Take 40 mg by mouth daily.   07/29/2023   allopurinol (ZYLOPRIM) 100 MG tablet Take 200 mg by mouth daily. (Patient not taking: Reported on 07/30/2023)   Not Taking   amLODipine (NORVASC) 10 MG tablet Take 10 mg by mouth daily. (Patient not taking: Reported on 07/30/2023)   Not Taking   gabapentin (NEURONTIN) 300 MG capsule Take 300 mg by mouth 3 (three) times daily. (Patient not taking: Reported on 07/30/2023)   Not Taking   hydrALAZINE (APRESOLINE) 100 MG tablet Take 100 mg by mouth 2 (two) times daily. (Patient not taking: Reported on 07/30/2023)   Not Taking   indomethacin  (INDOCIN) 25 MG capsule Take 25 mg by mouth 2 (two) times daily with a meal. (Patient not taking: Reported on 07/30/2023)   Not Taking   lisinopril (ZESTRIL) 40 MG tablet Take 40 mg by mouth daily. (Patient not taking: Reported on 07/30/2023)   Not Taking   lisinopril-hydrochlorothiazide (ZESTORETIC) 20-25 MG tablet Take 1 tablet by mouth daily. (Patient not taking: Reported on 07/30/2023)   Not Taking   metFORMIN (GLUCOPHAGE-XR) 750 MG 24 hr tablet Take 750 mg by mouth 2 (two) times daily. (Patient not taking: Reported on 07/30/2023)   Not Taking   metoprolol tartrate (LOPRESSOR) 100 MG tablet Take 100 mg by mouth 2 (two) times daily. (Patient not taking: Reported on 07/30/2023)   Not Taking   montelukast (SINGULAIR) 10 MG tablet Take 10 mg by mouth daily. (Patient not taking: Reported on 07/30/2023)   Not Taking   potassium chloride (K-DUR) 10 MEQ tablet Take 10 mEq by mouth daily. (Patient not taking: Reported on 07/30/2023)   Not Taking   primidone (MYSOLINE) 50 MG tablet Take by mouth 4 (four) times daily. (Patient not taking: Reported on 07/30/2023)   Not Taking   Social History   Socioeconomic History   Marital status: Married    Spouse name: Not on file   Number of children: Not on file   Years of education: Not on file   Highest education level: Not on file  Occupational History   Not on file  Tobacco Use   Smoking status: Never   Smokeless tobacco: Current  Substance and Sexual Activity   Alcohol use: Yes   Drug use: Never   Sexual activity: Not on file  Other Topics Concern   Not on file  Social History Narrative   Not on file   Social Determinants of Health   Financial Resource Strain: Medium Risk (01/20/2023)   Received from [facility]   Overall Financial Resource Strain (CARDIA)    Difficulty of Paying Living Expenses: Somewhat hard  Food Insecurity: No Food Insecurity (07/31/2023)   Hunger Vital Sign    Worried About Running Out of Food in the  Last Year: Never true    Ran Out of Food in the Last Year: Never true  Transportation Needs: No Transportation Needs (07/31/2023)   PRAPARE - Administrator, Civil Service (Medical): No    Lack of Transportation (Non-Medical): No  Physical Activity: Not on file  Stress: Not on file  Social Connections: Not on file  Intimate Partner Violence: Not At Risk (07/31/2023)   Humiliation, Afraid, Rape, and Kick questionnaire    Fear of Current or Ex-Partner: No    Emotionally Abused: No    Physically Abused: No    Sexually Abused: No    History reviewed. No pertinent family history.   Vitals:   08/06/23 0417 08/06/23 0844 08/06/23 0846 08/06/23 1232  BP: 115/70 (!) 133/94  118/89  Pulse: 73 (!) 59  (!) 59  Resp: 18 20  20   Temp: 98.3 F (36.8 C) 98.4 F (36.9 C)  98.5 F (36.9 C)  TempSrc:  Oral  Oral  SpO2: 96% 94%  95%  Weight:   70.9 kg   Height:        PHYSICAL EXAM General: Elderly appearing male, well nourished, in no acute distress sitting upright in bedside chair.  HEENT: Normocephalic and atraumatic. Neck: No JVD.   Lungs: Normal respiratory effort on room air. Clear bilaterally to auscultation. No wheezes, crackles.  Heart: HRRR. Normal S1 and S2 without gallops or murmurs.  Abdomen: Non-distended appearing.  Msk: Normal strength and tone for age. Extremities: Warm and well perfused. No clubbing, cyanosis. No edema.  Neuro: Alert and oriented X 3. Psych: Answers questions appropriately.   Labs: Basic Metabolic Panel: Recent Labs    08/05/23 0415 08/06/23 0711  NA 140 134*  K 3.6 3.3*  CL 105 99  CO2 23 22  GLUCOSE 121* 178*  BUN 31* 33*  CREATININE 1.55* 1.90*  CALCIUM 8.7* 8.7*   Liver Function Tests: No results for input(s): "AST", "ALT", "ALKPHOS", "BILITOT", "PROT", "ALBUMIN" in the last 72 hours.  No results for input(s): "LIPASE", "AMYLASE" in the last 72 hours. CBC: Recent Labs    08/04/23 0517 08/05/23 0415  WBC 11.5* 9.0  HGB  15.1 15.1  HCT 44.1 43.6  MCV 87.2 86.2  PLT 158 152   Cardiac Enzymes: No results for input(s): "CKTOTAL", "CKMB", "CKMBINDEX", "TROPONINIHS" in the last 72 hours.  BNP: No results for input(s): "BNP" in the last 72 hours.  D-Dimer: No results for input(s): "DDIMER" in the last 72 hours. Hemoglobin A1C: No results for input(s): "HGBA1C" in the last 72 hours. Fasting Lipid Panel: No results for input(s): "CHOL", "HDL", "LDLCALC", "TRIG", "CHOLHDL", "LDLDIRECT" in the last 72 hours. Thyroid Function Tests: No results for input(s): "TSH", "T4TOTAL", "T3FREE", "THYROIDAB" in the last 72 hours.  Invalid input(s): "FREET3" Anemia Panel: No results for input(s): "VITAMINB12", "FOLATE", "  FERRITIN", "TIBC", "IRON", "RETICCTPCT" in the last 72 hours.   Radiology: CARDIAC CATHETERIZATION  Result Date: 08/05/2023   RPDA lesion is 100% stenosed.   RPAV lesion is 95% stenosed.   Mid LAD lesion is 99% stenosed.   Prox LAD lesion is 75% stenosed.   Mid LAD to Dist LAD lesion is 99% stenosed.   Prox RCA lesion is 50% stenosed.   Mid RCA lesion is 50% stenosed.   Ost Cx lesion is 90% stenosed.   Prox Cx lesion is 75% stenosed.   Prox Cx to Mid Cx lesion is 50% stenosed.   There is severe left ventricular systolic dysfunction.   LV end diastolic pressure is severely elevated.   ECHOCARDIOGRAM LIMITED  Result Date: 08/05/2023    ECHOCARDIOGRAM LIMITED REPORT   Patient Name:   JAASIEL HOLLYFIELD [name] Date of Exam: 08/05/2023 Medical Rec #:  [id number]          Height:       59.0 in Accession #:    [id number]         Weight:       174.2 lb Date of Birth:  10/27/1950          BSA:          1.739 m Patient Age:    72 years           BP:           137/94 mmHg Patient Gender: M                  HR:           55 bpm. Exam Location:  [facility] Procedure: Limited Echo and Intracardiac Opacification Agent Indications:     Ischemic cardiomyopathy, CHF  History:         Patient has prior history of Echocardiogram examinations,  most                  recent 07/30/2023. Cardiomyopathy and CHF, Acute MI, COPD; Risk                  Factors:Dyslipidemia. CKD.  Sonographer:     Mikki Harbor Referring Phys:  [id number] DWAYNE D CALLWOOD Diagnosing Phys: Clotilde Dieter  Sonographer Comments: Technically difficult study due to poor echo windows. IMPRESSIONS  1. 2x2 cm apical thrombus noted. Left ventricular ejection fraction, by estimation, is <20%. The left ventricle has severely decreased function. The left ventricle demonstrates global hypokinesis. Left ventricular diastolic function could not be evaluated.  2. Right ventricular systolic function is moderately reduced. The right ventricular size is normal.  3. The mitral valve was not assessed.  4. The aortic valve was not assessed. FINDINGS  Left Ventricle: 2x2 cm apical thrombus noted. Left ventricular ejection fraction, by estimation, is <20%. The left ventricle has severely decreased function. The left ventricle demonstrates global hypokinesis. Definity contrast agent was given IV to delineate the left ventricular endocardial borders. The left ventricular internal cavity size was normal in size. Left ventricular diastolic function could not be evaluated. Right Ventricle: The right ventricular size is normal. No increase in right ventricular wall thickness. Right ventricular systolic function is moderately reduced. Mitral Valve: The mitral valve was not assessed. Tricuspid Valve: The tricuspid valve is not assessed. Aortic Valve: The aortic valve was not assessed. Pulmonic Valve: The pulmonic valve was not assessed. LEFT VENTRICLE PLAX 2D LVIDd:         4.90 cm LVIDs:  4.20 cm LV PW:         1.00 cm LV IVS:        1.30 cm LVOT diam:     2.10 cm LVOT Area:     3.46 cm  LEFT ATRIUM         Index LA diam:    3.90 cm 2.24 cm/m   AORTA Ao Root diam: 3.30 cm  SHUNTS Systemic Diam: 2.10 cm Rozell Searing Custovic Electronically signed by Clotilde Dieter Signature Date/Time: 08/05/2023/1:53:56 PM     Final    ECHOCARDIOGRAM COMPLETE  Result Date: 07/30/2023    ECHOCARDIOGRAM REPORT   Patient Name:   CHRISTERPHER CLOS [name] Date of Exam: 07/30/2023 Medical Rec #:  [id number]          Height:       59.0 in Accession #:    [id number]         Weight:       176.0 lb Date of Birth:  1951-08-02          BSA:          1.747 m Patient Age:    72 years           BP:           124/102 mmHg Patient Gender: M                  HR:           118 bpm. Exam Location:  [facility] Procedure: 2D Echo, Cardiac Doppler, Color Doppler and Intracardiac            Opacification Agent Indications:     CHF  History:         Patient has no prior history of Echocardiogram examinations.                  CHF, Acute MI, COPD; Risk Factors:Dyslipidemia.  Sonographer:     Mikki Harbor Referring Phys:  [id number] JAN A MANSY Diagnosing Phys: Julien Nordmann MD  Sonographer Comments: Image acquisition challenging due to COPD. IMPRESSIONS  1. Left ventricular ejection fraction, by estimation, is 30 to 35%. Left ventricular ejection fraction by PLAX is 32 %. The left ventricle has moderately decreased function. The left ventricle demonstrates global hypokinesis. The left ventricular internal cavity size was mildly dilated. Left ventricular diastolic parameters are indeterminate.  2. Right ventricular systolic function is normal. The right ventricular size is normal. Tricuspid regurgitation signal is inadequate for assessing PA pressure. The estimated right ventricular systolic pressure is 17.4 mmHg.  3. The mitral valve is normal in structure. Mild to moderate mitral valve regurgitation. No evidence of mitral stenosis.  4. The aortic valve is normal in structure. Aortic valve regurgitation is mild. No aortic stenosis is present.  5. The inferior vena cava is normal in size with greater than 50% respiratory variability, suggesting right atrial pressure of 3 mmHg. FINDINGS  Left Ventricle: Left ventricular ejection fraction, by estimation, is 30 to 35%.  Left ventricular ejection fraction by PLAX is 32 %. The left ventricle has moderately decreased function. The left ventricle demonstrates global hypokinesis. Definity contrast agent was given IV to delineate the left ventricular endocardial borders. The left ventricular internal cavity size was mildly dilated. There is no left ventricular hypertrophy. Left ventricular diastolic parameters are indeterminate. Right Ventricle: The right ventricular size is normal. No increase in right ventricular wall thickness. Right ventricular systolic function is normal. Tricuspid regurgitation signal is inadequate for assessing  PA pressure. The tricuspid regurgitant velocity is 1.90 m/s, and with an assumed right atrial pressure of 3 mmHg, the estimated right ventricular systolic pressure is 17.4 mmHg. Left Atrium: Left atrial size was normal in size. Right Atrium: Right atrial size was normal in size. Pericardium: There is no evidence of pericardial effusion. Mitral Valve: The mitral valve is normal in structure. Mild to moderate mitral valve regurgitation. No evidence of mitral valve stenosis. MV peak gradient, 5.5 mmHg. The mean mitral valve gradient is 2.0 mmHg. Tricuspid Valve: The tricuspid valve is normal in structure. Tricuspid valve regurgitation is mild . No evidence of tricuspid stenosis. Aortic Valve: The aortic valve is normal in structure. Aortic valve regurgitation is mild. No aortic stenosis is present. Aortic valve mean gradient measures 3.0 mmHg. Aortic valve peak gradient measures 4.8 mmHg. Aortic valve area, by VTI measures 2.39 cm. Pulmonic Valve: The pulmonic valve was normal in structure. Pulmonic valve regurgitation is not visualized. No evidence of pulmonic stenosis. Aorta: The aortic root is normal in size and structure. Venous: The inferior vena cava is normal in size with greater than 50% respiratory variability, suggesting right atrial pressure of 3 mmHg. IAS/Shunts: No atrial level shunt detected by  color flow Doppler.  LEFT VENTRICLE PLAX 2D LV EF:         Left            Diastology                ventricular     LV e' medial:    10.80 cm/s                ejection        LV E/e' medial:  9.1                fraction by     LV e' lateral:   9.14 cm/s                PLAX is 32      LV E/e' lateral: 10.8                %. LVIDd:         4.70 cm LVIDs:         4.00 cm LV PW:         1.30 cm LV IVS:        1.20 cm LVOT diam:     2.00 cm LV SV:         43 LV SV Index:   25 LVOT Area:     3.14 cm  LV Volumes (MOD) LV vol d, MOD    96.6 ml A4C: LV vol s, MOD    63.9 ml A4C: LV SV MOD A4C:   96.6 ml RIGHT VENTRICLE RV Basal diam:  3.20 cm RV Mid diam:    2.80 cm RV S prime:     12.60 cm/s LEFT ATRIUM             Index        RIGHT ATRIUM           Index LA diam:        3.80 cm 2.18 cm/m   RA Area:     12.80 cm LA Vol (A2C):   47.7 ml 27.31 ml/m  RA Volume:   33.40 ml  19.12 ml/m LA Vol (A4C):   53.4 ml 30.57 ml/m LA Biplane Vol: 51.4 ml 29.42 ml/m  AORTIC VALVE  PULMONIC VALVE AV Area (Vmax):    2.46 cm     PV Vmax:       0.75 m/s AV Area (Vmean):   2.12 cm     PV Peak grad:  2.3 mmHg AV Area (VTI):     2.39 cm AV Vmax:           109.50 cm/s AV Vmean:          81.750 cm/s AV VTI:            0.180 m AV Peak Grad:      4.8 mmHg AV Mean Grad:      3.0 mmHg LVOT Vmax:         85.67 cm/s LVOT Vmean:        55.067 cm/s LVOT VTI:          0.136 m LVOT/AV VTI ratio: 0.76  AORTA Ao Root diam: 3.30 cm MITRAL VALVE               TRICUSPID VALVE MV Area (PHT): 7.32 cm    TR Peak grad:   14.4 mmHg MV Area VTI:   1.95 cm    TR Vmax:        190.00 cm/s MV Peak grad:  5.5 mmHg MV Mean grad:  2.0 mmHg    SHUNTS MV Vmax:       1.17 m/s    Systemic VTI:  0.14 m MV Vmean:      65.6 cm/s   Systemic Diam: 2.00 cm MV Decel Time: 104 msec MV E velocity: 98.38 cm/s MV A velocity: 84.35 cm/s MV E/A ratio:  1.17 Julien Nordmann MD Electronically signed by Julien Nordmann MD Signature Date/Time: 07/30/2023/1:36:14 PM     Final    DG Chest 1 View  Result Date: 07/30/2023 CLINICAL DATA:  Shortness of breath EXAM: CHEST  1 VIEW COMPARISON:  05/19/2014 FINDINGS: Low lung volumes accentuate pulmonary vascularity. Normal cardiomediastinal silhouette. Patchy bilateral airspace opacities. Retrocardiac atelectasis or consolidation. Suspected small bilateral pleural effusions. No pneumothorax. No displaced rib fractures. IMPRESSION: Patchy airspace opacities may be due to atelectasis or pneumonia. Suspected small bilateral pleural effusions. Electronically Signed   By: Minerva Fester M.D.   On: 07/30/2023 02:30    ECHO As above (EF 30-35%)  TELEMETRY reviewed by me 08/06/2023: sinus rhythm rate 60s  EKG reviewed by me: Sinus tachy with PACs, RBBB, rate 133 bpm (non-acute)  Pertinent Cardiac History CT Coronary 11/24/2022: Aorta normal caliber without dissection. Atherosclerotic calcifications descending thoracic aorta. No pericardial effusion. Visualized pulmonary arteries patent.  Stress Test 08/07/2022:  Abnormal myocardial perfusion scar large area of anterior apical inferior apical defect with significant reversibility consistent with ischemia overall left ventricular function is normal around 59% no clear wall motion abnormality study is consistent with ischemia this is a  high risk scan recommend further evaluation invasively. Regional wall motion reveals normal myocardial thickening and wall motion. LVEF 59%  Data reviewed by me 08/06/2023: last 24h vitals tele labs imaging I/O hospitalist progress note  Principal Problem:   Acute respiratory failure with hypoxia (HCC) Active Problems:   Non-STEMI (non-ST elevated myocardial infarction) (HCC)   Dyslipidemia   Peripheral neuropathy   COPD exacerbation (HCC)   Acute congestive heart failure (HCC)   Acute HFrEF (heart failure with reduced ejection fraction) (HCC)   Chronic kidney disease, stage 3a (HCC)    ASSESSMENT AND PLAN:  Larry [name] is a 72  y.o. male  with a past  medical history of hyperlipidemia, hypertension, type 2 diabetes mellitus, stroke, neuropathy, RLS, abdominal hernia, GERD who presented to the ED on 07/29/2023 for shortness of breath and cough for 2 weeks. Cardiology was consulted for further evaluation.   # Acute HFrEF  # Ischemic cardiomyopathy # Apical thrombus # Hypertension Upon admission patient reports worsening shortness of breath for past 2 weeks. Patient states he can't walk without getting SOB and this is new for him. BNP 1,281. Did require BiPAP initially in the ED, now weaned to room air. Echo this admission with newly reduced EF to 30-35%. LHC 11/5 with EF <20%, apical akinesis, apical thrombus. Echo yesterday with EF <20%, apical thrombus noted.  -Continue eliquis 5 mg twice daily.  -Continue spironolactone 25 mg daily, losartan 50 mg twice daily, carvedilol 25 mg twice daily. Further additions to GDMT with SGLT2i/transitioning to [facility] limited by coverage concerns.  -Hold diuretics today. Plan to start po tomorrow.  -LifeVest placed given significantly reduced EF.   #NSTEMI #Hyperlipidemia Patient reporting worsening DOE for 2 weeks. Denies chest pain or pressure. Upon admission patient has been tachycardiac with blood pressure slightly elevated. Troponin 286 > 481 > 793. Patient received 324 mg ASA. EKG showed sinus tachy with PACs, RBBB, rate 133 bpm, which is non-acute. Most recent lipid panel 06/2023 with TC 109 and LDL 46. LHC yesterday with multivessel disease, would likely need bypass evaluation in the future but not currently surgical candidate given significant LV dysfunction -Continue rosuvastatin 40 mg, aspirin 81 mg daily, and plavix 75 mg daily. Will plan for triple therapy x2 weeks then will discontinue aspirin.   #Chronic kidney disease 3a Cr 1.44 on presentation, variable throughout admission. Cr 1.9 on AM labs today. Baseline around 1.4-1.5.  -Monitor renal function closely with  diureses.  -Monitor electrolytes, strict I's & O's, daily weights      This patient's plan of care was discussed and created with Dr. Melton Alar and she is in agreement.  Signed: Gale Journey, PA-C  08/06/2023, 1:03 PM [facility] Cardiology

## 2023-08-06 NOTE — Discharge Summary (Signed)
Physician Discharge Summary   Patient: Larry [name] MRN: [id number] DOB: 10-Aug-1951  Admit date:     07/29/2023  Discharge date: 08/06/23  Discharge Physician: Lurene Shadow   PCP: Rayetta Humphrey, MD   Recommendations at discharge:   Follow-up with Dr. Juliann Pares, cardiologist, in 1 week Follow-up with CHF clinic as an outpatient  Discharge Diagnoses: Principal Problem:   Acute respiratory failure with hypoxia (HCC) Active Problems:   Non-STEMI (non-ST elevated myocardial infarction) (HCC)   Acute HFrEF (heart failure with reduced ejection fraction) (HCC)   COPD exacerbation (HCC)   Chronic kidney disease, stage 3a (HCC)   Peripheral neuropathy   Dyslipidemia   Acute congestive heart failure (HCC)  Resolved Problems:   * No resolved hospital problems. *  Hospital Course:   Larry [name] is a 72 y.o. male male with medical history significant for essential hypertension, COPD who presented to the emergency room with acute onset of worsening dyspnea with associated cough productive of clear sputum over the last couple weeks with no fever or chills.  He also reported orthopnea, paroxysmal nocturnal dyspnea, dyspnea on exertion.   On presentation he was found to have elevated blood pressure at 175/127, tachycardic at 138, tachypneic and hypoxic requiring BiPAP. ABG with pH of 7.44 and pCO2 of 30 with pO2 of 81 and O2 sat of 97.4% with bicarbonate of 20.  CBC showed mild hypokalemia of 3.4 and blood glucose of 189 with CO2 of 21 and AST 46 total protein 6.2 total bili 1.3 and BUN and creatinine were 16 and 1.44 above previous levels in 2020.  I sensitive troponin I was 286 and later 481 with a BNP of 1281.2.  CBC was within normal.  INR is 1.2 and PT 15.4 with PTT of 25.  Respiratory panel came back negative. EKG with sinus tachycardia and premature supraventricular complexes with RBBB and Q waves in septal leads, T wave inversion inferiorly and laterally. CXR with patchy  airspace opacity may be due to atelectasis or pneumonia with suspected small bilateral pleural effusions.   The patient was given 40 mg of IV Lasix 125 mg of IV Solu-Medrol 2 g of IV magnesium sulfate and DuoNeb. He was later started on IV heparin with increasing troponin I.    10/31: Vital stable, able to wean off from BiPAP, currently on 4 L of oxygen with no baseline oxygen use.  Renal function seems to be at baseline of 1.4-1.5.  Slowly rising troponin currently at 793. Echocardiogram with a EF of 30 to 35%, global hypokinesis and mildly dilated LV cavity.  Patient had normal EF on an echo done last year.  Cardiology is on board and likely will need ischemic workup.   Patient apparently stopped taking all of his medications at home for concern of long-term side effects.   11/1: Vital stable, some improvement in creatinine to 1.4, slight worsening of leukocytosis but patient is on steroid.  Troponin continued to rise at 2627, new diagnosis of HFrEF likely patient has NSTEMI.  Still significant orthopnea, cardiology would like to diurese more before taking him to Cath Lab.   11/2: Patient with some improved orthopnea, increasing creatinine so decreased Lasix to daily, cardiac cath on Monday.   11/3: Vital stable, creatinine with some improvement to 1.54 today.  Cath tomorrow.   11/4: Vital stable, orthopnea improved.  Cardiac cath which was originally planned for today got canceled for tomorrow.   11/5: Vital remains stable, Lasix was switched with  p.o., remained on heparin infusion and going for cardiac catheterization later today.       Assessment and Plan:   Acute systolic heart failure: Repeat 2D echo on 05/2023 showed EF estimated at less than 20%, global hypokinesis of left ventricle, moderately reduced right ventricular systolic function. Start oral Lasix tomorrow, 08/07/2023 Continue carvedilol, losartan and Aldactone. LifeVest was applied to today     Acute NSTEMI: Previously  treated with IV heparin drip.  S/p left heart cath on 08/04/2023 Left ventriculogram Severely depressed left ventricular function anterior apical hypokinesis apical thrombus EF less than 20% left ventricular enlargement Coronary artery disease Left main LAD ostial LAD, 90% proximal,100% CTO Circumflex large ostial 75% minor irregularities RCA large ectasia minor irregularities 90% proximal PL 100% proximal PDA Right dominant system   He is a poor surgical candidate at this time per cardiologist. Aspirin, Plavix and Eliquis for 2 weeks followed by only Plavix and Eliquis      Acute hypoxemic respiratory failure: Resolved.  He is tolerating room air.     COPD exacerbation: Completed course of steroids.  Bronchodilators as needed.     Comorbidities include peripheral neuropathy, dyslipidemia, CKD stage IIIa     Plan discussed with Steward Drone, sister, at the bedside.  The importance of medical adherence was reiterated. Case was discussed with Dr. Melton Alar, cardiologist and Dorian Pod, cardiology PA, via secure chat.  Patient is okay for discharge from her standpoint.       Consultants: Cardiologist Procedures performed: Left heart cath Disposition: Home Diet recommendation:  Discharge Diet Orders (From admission, onward)     Start     Ordered   08/06/23 0000  Diet - low sodium heart healthy        08/06/23 1313           Cardiac diet DISCHARGE MEDICATION: Allergies as of 08/06/2023   No Known Allergies      Medication List     STOP taking these medications    amLODipine 10 MG tablet Commonly known as: NORVASC   hydrALAZINE 100 MG tablet Commonly known as: APRESOLINE   indomethacin 25 MG capsule Commonly known as: INDOCIN   lisinopril 40 MG tablet Commonly known as: ZESTRIL   lisinopril-hydrochlorothiazide 20-25 MG tablet Commonly known as: ZESTORETIC   metoprolol tartrate 100 MG tablet Commonly known as: LOPRESSOR       TAKE these medications     albuterol 108 (90 Base) MCG/ACT inhaler Commonly known as: VENTOLIN HFA Inhale 2 puffs into the lungs every 4 (four) hours as needed for wheezing or shortness of breath.   allopurinol 100 MG tablet Commonly known as: ZYLOPRIM Take 200 mg by mouth daily.   apixaban 5 MG Tabs tablet Commonly known as: ELIQUIS Take 1 tablet (5 mg total) by mouth 2 (two) times daily.   aspirin 81 MG chewable tablet Chew 1 tablet (81 mg total) by mouth daily for 12 days. Start taking on: August 07, 2023   carvedilol 12.5 MG tablet Commonly known as: COREG Take 1 tablet (12.5 mg total) by mouth 2 (two) times daily with a meal.   clopidogrel 75 MG tablet Commonly known as: PLAVIX Take 1 tablet (75 mg total) by mouth daily with breakfast. Start taking on: August 07, 2023   famotidine 20 MG tablet Commonly known as: PEPCID Take 20 mg by mouth 2 (two) times daily.   furosemide 40 MG tablet Commonly known as: Lasix Take 1 tablet (40 mg total) by mouth daily. Start taking on: August 07, 2023   gabapentin 300 MG capsule Commonly known as: NEURONTIN Take 300 mg by mouth 3 (three) times daily.   losartan 50 MG tablet Commonly known as: COZAAR Take 1 tablet (50 mg total) by mouth 2 (two) times daily.   metFORMIN 750 MG 24 hr tablet Commonly known as: GLUCOPHAGE-XR Take 750 mg by mouth 2 (two) times daily.   montelukast 10 MG tablet Commonly known as: SINGULAIR Take 10 mg by mouth daily.   potassium chloride 10 MEQ tablet Commonly known as: KLOR-CON Take 10 mEq by mouth daily.   primidone 50 MG tablet Commonly known as: MYSOLINE Take by mouth 4 (four) times daily.   rosuvastatin 40 MG tablet Commonly known as: CRESTOR Take 40 mg by mouth daily.   spironolactone 25 MG tablet Commonly known as: ALDACTONE Take 1 tablet (25 mg total) by mouth daily. Start taking on: August 07, 2023        Follow-up Information     Dorothyann Peng D, MD. Go in 1 week(s).   Specialties:  Cardiology, Internal Medicine Why: Patient scheduled for Heart Failure clinic appointment at [facility] on 11/12 at 3:30 PM Contact information: [address] [place] [address] [phone]                Discharge Exam: Ceasar Mons Weights   08/02/23 0400 08/05/23 0500 08/06/23 0846  Weight: 77.7 kg 79 kg 70.9 kg   GEN: NAD SKIN: Warm and dry EYES: No pallor or icterus ENT: MMM CV: RRR PULM: Air entry adequate bilaterally.  Bibasilar rales.  No wheezing. ABD: soft, ND, NT, +BS CNS: AAO x 3, non focal EXT: No edema or tenderness   Condition at discharge: good  The results of significant diagnostics from this hospitalization (including imaging, microbiology, ancillary and laboratory) are listed below for reference.   Imaging Studies: CARDIAC CATHETERIZATION  Result Date: 08/05/2023   RPDA lesion is 100% stenosed.   RPAV lesion is 95% stenosed.   Mid LAD lesion is 99% stenosed.   Prox LAD lesion is 75% stenosed.   Mid LAD to Dist LAD lesion is 99% stenosed.   Prox RCA lesion is 50% stenosed.   Mid RCA lesion is 50% stenosed.   Ost Cx lesion is 90% stenosed.   Prox Cx lesion is 75% stenosed.   Prox Cx to Mid Cx lesion is 50% stenosed.   There is severe left ventricular systolic dysfunction.   LV end diastolic pressure is severely elevated.   ECHOCARDIOGRAM LIMITED  Result Date: 08/05/2023    ECHOCARDIOGRAM LIMITED REPORT   Patient Name:   Larry [name] Date of Exam: 08/05/2023 Medical Rec #:  [id number]          Height:       59.0 in Accession #:    [id number]         Weight:       174.2 lb Date of Birth:  11/17/1950          BSA:          1.739 m Patient Age:    72 years           BP:           137/94 mmHg Patient Gender: M                  HR:           55 bpm. Exam Location:  [facility] Procedure: Limited Echo and Intracardiac Opacification Agent  Indications:     Ischemic cardiomyopathy, CHF  History:         Patient has prior history of Echocardiogram examinations,  most                  recent 07/30/2023. Cardiomyopathy and CHF, Acute MI, COPD; Risk                  Factors:Dyslipidemia. CKD.  Sonographer:     Mikki Harbor Referring Phys:  [id number] DWAYNE D CALLWOOD Diagnosing Phys: Clotilde Dieter  Sonographer Comments: Technically difficult study due to poor echo windows. IMPRESSIONS  1. 2x2 cm apical thrombus noted. Left ventricular ejection fraction, by estimation, is <20%. The left ventricle has severely decreased function. The left ventricle demonstrates global hypokinesis. Left ventricular diastolic function could not be evaluated.  2. Right ventricular systolic function is moderately reduced. The right ventricular size is normal.  3. The mitral valve was not assessed.  4. The aortic valve was not assessed. FINDINGS  Left Ventricle: 2x2 cm apical thrombus noted. Left ventricular ejection fraction, by estimation, is <20%. The left ventricle has severely decreased function. The left ventricle demonstrates global hypokinesis. Definity contrast agent was given IV to delineate the left ventricular endocardial borders. The left ventricular internal cavity size was normal in size. Left ventricular diastolic function could not be evaluated. Right Ventricle: The right ventricular size is normal. No increase in right ventricular wall thickness. Right ventricular systolic function is moderately reduced. Mitral Valve: The mitral valve was not assessed. Tricuspid Valve: The tricuspid valve is not assessed. Aortic Valve: The aortic valve was not assessed. Pulmonic Valve: The pulmonic valve was not assessed. LEFT VENTRICLE PLAX 2D LVIDd:         4.90 cm LVIDs:         4.20 cm LV PW:         1.00 cm LV IVS:        1.30 cm LVOT diam:     2.10 cm LVOT Area:     3.46 cm  LEFT ATRIUM         Index LA diam:    3.90 cm 2.24 cm/m   AORTA Ao Root diam: 3.30 cm  SHUNTS Systemic Diam: 2.10 cm Rozell Searing Custovic Electronically signed by Clotilde Dieter Signature Date/Time: 08/05/2023/1:53:56 PM     Final    ECHOCARDIOGRAM COMPLETE  Result Date: 07/30/2023    ECHOCARDIOGRAM REPORT   Patient Name:   Larry [name] Date of Exam: 07/30/2023 Medical Rec #:  [id number]          Height:       59.0 in Accession #:    [id number]         Weight:       176.0 lb Date of Birth:  28-Apr-1951          BSA:          1.747 m Patient Age:    72 years           BP:           124/102 mmHg Patient Gender: M                  HR:           118 bpm. Exam Location:  [facility] Procedure: 2D Echo, Cardiac Doppler, Color Doppler and Intracardiac            Opacification Agent Indications:     CHF  History:  Patient has no prior history of Echocardiogram examinations.                  CHF, Acute MI, COPD; Risk Factors:Dyslipidemia.  Sonographer:     Mikki Harbor Referring Phys:  [id number] JAN A MANSY Diagnosing Phys: Julien Nordmann MD  Sonographer Comments: Image acquisition challenging due to COPD. IMPRESSIONS  1. Left ventricular ejection fraction, by estimation, is 30 to 35%. Left ventricular ejection fraction by PLAX is 32 %. The left ventricle has moderately decreased function. The left ventricle demonstrates global hypokinesis. The left ventricular internal cavity size was mildly dilated. Left ventricular diastolic parameters are indeterminate.  2. Right ventricular systolic function is normal. The right ventricular size is normal. Tricuspid regurgitation signal is inadequate for assessing PA pressure. The estimated right ventricular systolic pressure is 17.4 mmHg.  3. The mitral valve is normal in structure. Mild to moderate mitral valve regurgitation. No evidence of mitral stenosis.  4. The aortic valve is normal in structure. Aortic valve regurgitation is mild. No aortic stenosis is present.  5. The inferior vena cava is normal in size with greater than 50% respiratory variability, suggesting right atrial pressure of 3 mmHg. FINDINGS  Left Ventricle: Left ventricular ejection fraction, by estimation, is 30 to 35%.  Left ventricular ejection fraction by PLAX is 32 %. The left ventricle has moderately decreased function. The left ventricle demonstrates global hypokinesis. Definity contrast agent was given IV to delineate the left ventricular endocardial borders. The left ventricular internal cavity size was mildly dilated. There is no left ventricular hypertrophy. Left ventricular diastolic parameters are indeterminate. Right Ventricle: The right ventricular size is normal. No increase in right ventricular wall thickness. Right ventricular systolic function is normal. Tricuspid regurgitation signal is inadequate for assessing PA pressure. The tricuspid regurgitant velocity is 1.90 m/s, and with an assumed right atrial pressure of 3 mmHg, the estimated right ventricular systolic pressure is 17.4 mmHg. Left Atrium: Left atrial size was normal in size. Right Atrium: Right atrial size was normal in size. Pericardium: There is no evidence of pericardial effusion. Mitral Valve: The mitral valve is normal in structure. Mild to moderate mitral valve regurgitation. No evidence of mitral valve stenosis. MV peak gradient, 5.5 mmHg. The mean mitral valve gradient is 2.0 mmHg. Tricuspid Valve: The tricuspid valve is normal in structure. Tricuspid valve regurgitation is mild . No evidence of tricuspid stenosis. Aortic Valve: The aortic valve is normal in structure. Aortic valve regurgitation is mild. No aortic stenosis is present. Aortic valve mean gradient measures 3.0 mmHg. Aortic valve peak gradient measures 4.8 mmHg. Aortic valve area, by VTI measures 2.39 cm. Pulmonic Valve: The pulmonic valve was normal in structure. Pulmonic valve regurgitation is not visualized. No evidence of pulmonic stenosis. Aorta: The aortic root is normal in size and structure. Venous: The inferior vena cava is normal in size with greater than 50% respiratory variability, suggesting right atrial pressure of 3 mmHg. IAS/Shunts: No atrial level shunt detected by  color flow Doppler.  LEFT VENTRICLE PLAX 2D LV EF:         Left            Diastology                ventricular     LV e' medial:    10.80 cm/s                ejection        LV E/e' medial:  9.1  fraction by     LV e' lateral:   9.14 cm/s                PLAX is 32      LV E/e' lateral: 10.8                %. LVIDd:         4.70 cm LVIDs:         4.00 cm LV PW:         1.30 cm LV IVS:        1.20 cm LVOT diam:     2.00 cm LV SV:         43 LV SV Index:   25 LVOT Area:     3.14 cm  LV Volumes (MOD) LV vol d, MOD    96.6 ml A4C: LV vol s, MOD    63.9 ml A4C: LV SV MOD A4C:   96.6 ml RIGHT VENTRICLE RV Basal diam:  3.20 cm RV Mid diam:    2.80 cm RV S prime:     12.60 cm/s LEFT ATRIUM             Index        RIGHT ATRIUM           Index LA diam:        3.80 cm 2.18 cm/m   RA Area:     12.80 cm LA Vol (A2C):   47.7 ml 27.31 ml/m  RA Volume:   33.40 ml  19.12 ml/m LA Vol (A4C):   53.4 ml 30.57 ml/m LA Biplane Vol: 51.4 ml 29.42 ml/m  AORTIC VALVE                    PULMONIC VALVE AV Area (Vmax):    2.46 cm     PV Vmax:       0.75 m/s AV Area (Vmean):   2.12 cm     PV Peak grad:  2.3 mmHg AV Area (VTI):     2.39 cm AV Vmax:           109.50 cm/s AV Vmean:          81.750 cm/s AV VTI:            0.180 m AV Peak Grad:      4.8 mmHg AV Mean Grad:      3.0 mmHg LVOT Vmax:         85.67 cm/s LVOT Vmean:        55.067 cm/s LVOT VTI:          0.136 m LVOT/AV VTI ratio: 0.76  AORTA Ao Root diam: 3.30 cm MITRAL VALVE               TRICUSPID VALVE MV Area (PHT): 7.32 cm    TR Peak grad:   14.4 mmHg MV Area VTI:   1.95 cm    TR Vmax:        190.00 cm/s MV Peak grad:  5.5 mmHg MV Mean grad:  2.0 mmHg    SHUNTS MV Vmax:       1.17 m/s    Systemic VTI:  0.14 m MV Vmean:      65.6 cm/s   Systemic Diam: 2.00 cm MV Decel Time: 104 msec MV E velocity: 98.38 cm/s MV A velocity: 84.35 cm/s MV E/A ratio:  1.17 Julien Nordmann MD Electronically signed by Julien Nordmann MD Signature Date/Time: 07/30/2023/1:36:14  PM     Final    DG Chest 1 View  Result Date: 07/30/2023 CLINICAL DATA:  Shortness of breath EXAM: CHEST  1 VIEW COMPARISON:  05/19/2014 FINDINGS: Low lung volumes accentuate pulmonary vascularity. Normal cardiomediastinal silhouette. Patchy bilateral airspace opacities. Retrocardiac atelectasis or consolidation. Suspected small bilateral pleural effusions. No pneumothorax. No displaced rib fractures. IMPRESSION: Patchy airspace opacities may be due to atelectasis or pneumonia. Suspected small bilateral pleural effusions. Electronically Signed   By: Minerva Fester M.D.   On: 07/30/2023 02:30    Microbiology: Results for orders placed or performed during the hospital encounter of 07/29/23  Resp panel by RT-PCR (RSV, Flu A&B, Covid) Anterior Nasal Swab     Status: None   Collection Time: 07/30/23 12:40 AM   Specimen: Anterior Nasal Swab  Result Value Ref Range Status   SARS Coronavirus 2 by RT PCR NEGATIVE NEGATIVE Final    Comment: (NOTE) SARS-CoV-2 target nucleic acids are NOT DETECTED.  The SARS-CoV-2 RNA is generally detectable in upper respiratory specimens during the acute phase of infection. The lowest concentration of SARS-CoV-2 viral copies this assay can detect is 138 copies/mL. A negative result does not preclude SARS-Cov-2 infection and should not be used as the sole basis for treatment or other patient management decisions. A negative result may occur with  improper specimen collection/handling, submission of specimen other than nasopharyngeal swab, presence of viral mutation(s) within the areas targeted by this assay, and inadequate number of viral copies(<138 copies/mL). A negative result must be combined with clinical observations, patient history, and epidemiological information. The expected result is Negative.  Fact Sheet for Patients:  BloggerCourse.com  Fact Sheet for Healthcare Providers:  SeriousBroker.it  This test  is no t yet approved or cleared by the [place] FDA and  has been authorized for detection and/or diagnosis of SARS-CoV-2 by FDA under an Emergency Use Authorization (EUA). This EUA will remain  in effect (meaning this test can be used) for the duration of the COVID-19 declaration under Section 564(b)(1) of the Act, 21 U.S.C.section 360bbb-3(b)(1), unless the authorization is terminated  or revoked sooner.       Influenza A by PCR NEGATIVE NEGATIVE Final   Influenza B by PCR NEGATIVE NEGATIVE Final    Comment: (NOTE) The Xpert Xpress SARS-CoV-2/FLU/RSV plus assay is intended as an aid in the diagnosis of influenza from Nasopharyngeal swab specimens and should not be used as a sole basis for treatment. Nasal washings and aspirates are unacceptable for Xpert Xpress SARS-CoV-2/FLU/RSV testing.  Fact Sheet for Patients: BloggerCourse.com  Fact Sheet for Healthcare Providers: SeriousBroker.it  This test is not yet approved or cleared by the [place] FDA and has been authorized for detection and/or diagnosis of SARS-CoV-2 by FDA under an Emergency Use Authorization (EUA). This EUA will remain in effect (meaning this test can be used) for the duration of the COVID-19 declaration under Section 564(b)(1) of the Act, 21 U.S.C. section 360bbb-3(b)(1), unless the authorization is terminated or revoked.     Resp Syncytial Virus by PCR NEGATIVE NEGATIVE Final    Comment: (NOTE) Fact Sheet for Patients: BloggerCourse.com  Fact Sheet for Healthcare Providers: SeriousBroker.it  This test is not yet approved or cleared by the [place] FDA and has been authorized for detection and/or diagnosis of SARS-CoV-2 by FDA under an Emergency Use Authorization (EUA). This EUA will remain in effect (meaning this test can be used) for the duration of the COVID-19 declaration under Section  564(b)(1) of the Act,  21 U.S.C. section 360bbb-3(b)(1), unless the authorization is terminated or revoked.  Performed at [facility], [address] [place], [place] [address]     Labs: CBC: Recent Labs  Lab 08/01/23 0258 08/02/23 0536 08/03/23 0442 08/04/23 0517 08/05/23 0415  WBC 14.4* 10.0 12.3* 11.5* 9.0  HGB 13.8 15.0 14.2 15.1 15.1  HCT 40.8 44.5 41.1 44.1 43.6  MCV 89.9 89.4 88.0 87.2 86.2  PLT 175 179 158 158 152   Basic Metabolic Panel: Recent Labs  Lab 08/02/23 0536 08/03/23 0442 08/04/23 0517 08/05/23 0415 08/06/23 0711  NA 135 135 136 140 134*  K 4.0 4.0 3.5 3.6 3.3*  CL 105 102 102 105 99  CO2 22 23 23 23 22   GLUCOSE 185* 191* 115* 121* 178*  BUN 43* 41* 35* 31* 33*  CREATININE 1.54* 1.44* 1.55* 1.55* 1.90*  CALCIUM 8.5* 8.5* 8.8* 8.7* 8.7*   Liver Function Tests: No results for input(s): "AST", "ALT", "ALKPHOS", "BILITOT", "PROT", "ALBUMIN" in the last 168 hours. CBG: Recent Labs  Lab 08/05/23 1207 08/05/23 1556 08/05/23 2059 08/06/23 0841 08/06/23 1215  GLUCAP 160* 172* 190* 183* 173*    Discharge time spent: greater than 30 minutes.  Signed: Lurene Shadow, MD [facility] 08/06/2023

## 2023-08-06 NOTE — Telephone Encounter (Signed)
Advanced Heart Failure Patient Advocate Encounter  The patient was approved for a [employer] grant that will help cover the cost of Carvedilol, Entresto, Farxiga/Jardiance, Losartan, Metoprolol, Spironolactone.  Total amount awarded, $10,000.  Effective: 07/07/2023 - 07/05/2024.  BIN [id number] PCN [id number] Group [id number] ID [id number]  Pharmacy provided with approval and processing information. Patient informed while admitted.  Burnell Blanks, CPhT Rx Patient Advocate Phone: [phone]

## 2023-08-06 NOTE — Progress Notes (Signed)
Heart Failure Stewardship Pharmacy Note  PCP: Rayetta Humphrey, MD PCP-Cardiologist: None  HPI: Larry [name] is a 72 y.o. male with hypertension, dyslipidemia, CAD, CVA and COPD who presented with worsening of exertional dyspnea over the past 2 weeks. Arrived to the emergency department with tachycardia, hypertension, and hypoxia requiring BIPAP.  Troponin 2148 on admission, now 2627. BNP on admission significantly elevated to 1281.2. TTE this admission showed LVEF of 30-35%, mild-moderate MR. Cardiac cath showed severe multivessel disease and significantly elevated LVEDP of 44. LV-gram showed LVEF <20% and apical akinesis with thrombus, started on Eliquis. Noted to be a poor surgical candidate at this time. Planning to medically optimize CHF with GDMT and reconsider at a later date.  Pertinent cardiac history: Stress test and TTE in 05/2014 with normal LVEF and no evidence of ischemia. Stress test 07/2022 showed perfusion scar area of anterior apical inferior defect consistent with ischemia and LVEF was 59%. TTE 07/2022 showed normal LVEF.   Pertinent Lab Values: Creatinine  Date Value Ref Range Status  05/19/2014 1.78 (H) 0.60 - 1.30 mg/dL Final   Creatinine, Ser  Date Value Ref Range Status  08/06/2023 1.90 (H) 0.61 - 1.24 mg/dL Final   BUN  Date Value Ref Range Status  08/06/2023 33 (H) 8 - 23 mg/dL Final  40/98/1191 19 (H) 7 - 18 mg/dL Final   Potassium  Date Value Ref Range Status  08/06/2023 3.3 (L) 3.5 - 5.1 mmol/L Final  05/19/2014 3.4 (L) 3.5 - 5.1 mmol/L Final   Sodium  Date Value Ref Range Status  08/06/2023 134 (L) 135 - 145 mmol/L Final  05/19/2014 141 136 - 145 mmol/L Final   B Natriuretic Peptide  Date Value Ref Range Status  07/30/2023 1,281.2 (H) 0.0 - 100.0 pg/mL Final    Comment:    Performed at [facility], [address] [place], [place] [address]   Magnesium  Date Value Ref Range Status  07/30/2023 2.0 1.7 - 2.4 mg/dL Final     Comment:    Performed at [facility], [address] [place], [place] [address]   Hgb A1c MFr Bld  Date Value Ref Range Status  07/30/2023 6.2 (H) 4.8 - 5.6 % Final    Comment:    (NOTE) Pre diabetes:          5.7%-6.4%  Diabetes:              >6.4%  Glycemic control for   <7.0% adults with diabetes    Vital Signs: Admission weight: 176 lbs Temp:  [97.4 F (36.3 C)-98.4 F (36.9 C)] 98.4 F (36.9 C) (11/07 0844) Pulse Rate:  [57-73] 59 (11/07 0844) Cardiac Rhythm: Sinus bradycardia (11/07 0700) Resp:  [18-20] 20 (11/07 0844) BP: (113-133)/(70-94) 133/94 (11/07 0844) SpO2:  [92 %-96 %] 94 % (11/07 0844) Weight:  [70.9 kg (156 lb 4.8 oz)] 70.9 kg (156 lb 4.8 oz) (11/07 0846)  Intake/Output Summary (Last 24 hours) at 08/06/2023 1101 Last data filed at 08/06/2023 0100 Gross per 24 hour  Intake 120 ml  Output --  Net 120 ml   Current Heart Failure Medications:  Loop diuretic: none Beta-Blocker: carvedilol 12.5 mg BID ACEI/ARB/ARNI: losartan 50 mg BID MRA: spironolactone 25 mg daily SGLT2i: none  Prior to admission Heart Failure Medications:  Loop diuretic: none Beta-Blocker: metoprolol tartrate 100 mg BID  ACEI/ARB/ARNI: lisinopril 10 mg daily MRA: none SGLT2i: none Other vasoactive medications include amlodipine 10 mg daily  Assessment: 1. 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 NYHA class IIIB symptoms.  -Symptoms: Reports shortness of breath has improved with diuresis. Experienced AMS that improved with stopping primidone. No orthopnea. -Volume: Appears dry on exam. Creatinine bumped. Discussed with cardiology team and agree with holding diuretics today.  -Hemodynamics: BP was much improved after adding home medications, but has been high again over the last couple days.  -BB: metoprolol succinate was changed to carvedilol 25 mg BID. Give patient likely has low output  heart failure, discussed with the cardiology team and agree with reducing carvedilol dose today given it's negative inotropy can reduce cardiac reserve in patient with low output. -ACEI/ARB/ARNI: Continue losartan 50 mg twice daily for now. Can consolidate to once daily at discharge if stable on current dose. Can consider transition to Entresto 49/51 mg tomorrow if AKI improves and grant is approved.  -MRA: can continue spironolactone today. If creatinine worsens tomorrow, it may need to be held. -SGLT2i: Can consider adding prior to discharge after AKI resolves if grant is approved.  Plan: 1) Medication changes recommended at this time: - Agree with holding diuretics and decreasing carvedilol dose.  2) Patient assistance: -Has not yet met $545 deductible. -Copay for Sherryll Burger is $570.51, Farxiga $545.65, Jardiance $552.43. Prices will decrease considerably after deductible is met, but the deductible resets in January. -Applying for [employer] grant  3) Education: - Patient has been educated on current HF medications and potential additions to HF medication regimen - Patient verbalizes understanding that over the next few months, these medication doses may change and more medications may be added to optimize HF regimen - Patient has been educated on basic disease state pathophysiology and goals of therapy  Medication Assistance / Insurance Benefits Check: Does the patient have prescription insurance?    Type of insurance plan:  Does the patient qualify for medication assistance through manufacturers or grants? Pending  Outpatient Pharmacy: Prior to admission outpatient pharmacy: [facility]   Is the patient willing to utilize a [facility] pharmacy at discharge?: Yes  Please do not hesitate to reach out with questions or concerns,  Enos Fling, PharmD, CPP, BCPS Heart Failure Pharmacist  Phone - [phone] 08/06/2023 11:01 AM

## 2023-08-06 NOTE — Consult Note (Signed)
Medications delivered.   Paschal Dopp, PharmD, BCPS

## 2023-08-07 ENCOUNTER — Encounter: Payer: Self-pay | Admitting: Internal Medicine

## 2023-08-14 ENCOUNTER — Other Ambulatory Visit: Payer: Self-pay | Admitting: Cardiovascular Disease

## 2023-08-14 DIAGNOSIS — I5020 Unspecified systolic (congestive) heart failure: Secondary | ICD-10-CM

## 2023-08-24 ENCOUNTER — Encounter (HOSPITAL_COMMUNITY): Payer: Self-pay

## 2023-08-25 ENCOUNTER — Telehealth (HOSPITAL_COMMUNITY): Payer: Self-pay | Admitting: *Deleted

## 2023-08-25 NOTE — Telephone Encounter (Signed)
Attempted to call patient regarding upcoming cardiac MRI appointment. Unable to leave VM. Johney Frame RN Navigator Cardiac Imaging [facility] [phone] Office

## 2023-08-26 ENCOUNTER — Other Ambulatory Visit: Payer: Self-pay | Admitting: Cardiovascular Disease

## 2023-08-26 ENCOUNTER — Ambulatory Visit
Admission: RE | Admit: 2023-08-26 | Discharge: 2023-08-26 | Disposition: A | Discharge status: Home / Self Care | Payer: Medicare HMO | Source: Ambulatory Visit | Attending: Cardiovascular Disease | Admitting: Cardiovascular Disease

## 2023-08-26 DIAGNOSIS — I5020 Unspecified systolic (congestive) heart failure: Secondary | ICD-10-CM

## 2023-08-26 MED ORDER — GADOBUTROL 1 MMOL/ML IV SOLN
10.0000 mL | Freq: Once | INTRAVENOUS | Status: AC | PRN
  Administered 2023-08-26: 10 mL via INTRAVENOUS

## 2024-08-11 ENCOUNTER — Other Ambulatory Visit (HOSPITAL_COMMUNITY): Payer: Self-pay
